# Patient Record
Sex: Female | Born: 1965 | Hispanic: Yes | Marital: Married | Smoking: Never smoker
Health system: Southern US, Community
[De-identification: ages and names within clinical notes are randomized; demographics above are authoritative.]

## PROBLEM LIST (undated history)

## (undated) DIAGNOSIS — Z789 Other specified health status: Secondary | ICD-10-CM

## (undated) HISTORY — PX: CHOLECYSTECTOMY: SHX55

---

## 2004-04-11 ENCOUNTER — Emergency Department: Payer: Self-pay | Admitting: Emergency Medicine

## 2005-12-03 ENCOUNTER — Observation Stay: Payer: Self-pay | Admitting: General Surgery

## 2017-10-11 ENCOUNTER — Encounter: Payer: Self-pay | Admitting: Family Medicine

## 2018-07-24 ENCOUNTER — Encounter (HOSPITAL_COMMUNITY): Payer: Self-pay | Admitting: Emergency Medicine

## 2018-07-24 ENCOUNTER — Inpatient Hospital Stay (HOSPITAL_COMMUNITY): Payer: Worker's Compensation | Admitting: Certified Registered Nurse Anesthetist

## 2018-07-24 ENCOUNTER — Inpatient Hospital Stay (HOSPITAL_COMMUNITY): Payer: Worker's Compensation

## 2018-07-24 ENCOUNTER — Other Ambulatory Visit: Payer: Self-pay

## 2018-07-24 ENCOUNTER — Encounter (HOSPITAL_COMMUNITY)
Admission: EM | Disposition: A | Discharge status: Home / Self Care | Payer: Self-pay | Source: Home / Self Care | Attending: Student

## 2018-07-24 ENCOUNTER — Inpatient Hospital Stay (HOSPITAL_COMMUNITY)
Admission: EM | Admit: 2018-07-24 | Discharge: 2018-07-26 | DRG: 902 | Disposition: A | Discharge status: Home / Self Care | Payer: Worker's Compensation | Attending: Student | Admitting: Student

## 2018-07-24 DIAGNOSIS — S92332B Displaced fracture of third metatarsal bone, left foot, initial encounter for open fracture: Secondary | ICD-10-CM | POA: Diagnosis present

## 2018-07-24 DIAGNOSIS — W230XXA Caught, crushed, jammed, or pinched between moving objects, initial encounter: Secondary | ICD-10-CM | POA: Diagnosis present

## 2018-07-24 DIAGNOSIS — S99922A Unspecified injury of left foot, initial encounter: Secondary | ICD-10-CM | POA: Diagnosis present

## 2018-07-24 DIAGNOSIS — S92312A Displaced fracture of first metatarsal bone, left foot, initial encounter for closed fracture: Secondary | ICD-10-CM | POA: Diagnosis present

## 2018-07-24 DIAGNOSIS — S91312A Laceration without foreign body, left foot, initial encounter: Secondary | ICD-10-CM | POA: Diagnosis present

## 2018-07-24 DIAGNOSIS — Z419 Encounter for procedure for purposes other than remedying health state, unspecified: Secondary | ICD-10-CM

## 2018-07-24 DIAGNOSIS — S98132A Complete traumatic amputation of one left lesser toe, initial encounter: Principal | ICD-10-CM

## 2018-07-24 DIAGNOSIS — S9782XA Crushing injury of left foot, initial encounter: Secondary | ICD-10-CM | POA: Diagnosis present

## 2018-07-24 DIAGNOSIS — S92312B Displaced fracture of first metatarsal bone, left foot, initial encounter for open fracture: Secondary | ICD-10-CM

## 2018-07-24 DIAGNOSIS — T148XXA Other injury of unspecified body region, initial encounter: Secondary | ICD-10-CM

## 2018-07-24 DIAGNOSIS — S92322A Displaced fracture of second metatarsal bone, left foot, initial encounter for closed fracture: Secondary | ICD-10-CM | POA: Diagnosis present

## 2018-07-24 DIAGNOSIS — S92322B Displaced fracture of second metatarsal bone, left foot, initial encounter for open fracture: Secondary | ICD-10-CM

## 2018-07-24 DIAGNOSIS — S9782XS Crushing injury of left foot, sequela: Secondary | ICD-10-CM | POA: Diagnosis not present

## 2018-07-24 DIAGNOSIS — I96 Gangrene, not elsewhere classified: Secondary | ICD-10-CM | POA: Diagnosis not present

## 2018-07-24 DIAGNOSIS — Y990 Civilian activity done for income or pay: Secondary | ICD-10-CM | POA: Diagnosis not present

## 2018-07-24 DIAGNOSIS — S92532B Displaced fracture of distal phalanx of left lesser toe(s), initial encounter for open fracture: Secondary | ICD-10-CM | POA: Diagnosis present

## 2018-07-24 DIAGNOSIS — S93325S Dislocation of tarsometatarsal joint of left foot, sequela: Secondary | ICD-10-CM | POA: Diagnosis not present

## 2018-07-24 DIAGNOSIS — S98222A Partial traumatic amputation of two or more left lesser toes, initial encounter: Secondary | ICD-10-CM

## 2018-07-24 HISTORY — PX: ORIF TOE FRACTURE: SHX5032

## 2018-07-24 HISTORY — PX: AMPUTATION TOE: SHX6595

## 2018-07-24 HISTORY — PX: I & D EXTREMITY: SHX5045

## 2018-07-24 LAB — CBC WITH DIFFERENTIAL/PLATELET
ABS IMMATURE GRANULOCYTES: 0.08 10*3/uL — AB (ref 0.00–0.07)
BASOS PCT: 0 %
Basophils Absolute: 0.1 10*3/uL (ref 0.0–0.1)
Eosinophils Absolute: 0.1 10*3/uL (ref 0.0–0.5)
Eosinophils Relative: 0 %
HEMATOCRIT: 43.5 % (ref 36.0–46.0)
HEMOGLOBIN: 13.8 g/dL (ref 12.0–15.0)
IMMATURE GRANULOCYTES: 1 %
LYMPHS ABS: 2.5 10*3/uL (ref 0.7–4.0)
LYMPHS PCT: 15 %
MCH: 27.9 pg (ref 26.0–34.0)
MCHC: 31.7 g/dL (ref 30.0–36.0)
MCV: 87.9 fL (ref 80.0–100.0)
MONOS PCT: 4 %
Monocytes Absolute: 0.7 10*3/uL (ref 0.1–1.0)
NEUTROS ABS: 12.9 10*3/uL — AB (ref 1.7–7.7)
NEUTROS PCT: 80 %
PLATELETS: 211 10*3/uL (ref 150–400)
RBC: 4.95 MIL/uL (ref 3.87–5.11)
RDW: 13.9 % (ref 11.5–15.5)
WBC: 16.3 10*3/uL — ABNORMAL HIGH (ref 4.0–10.5)
nRBC: 0 % (ref 0.0–0.2)

## 2018-07-24 LAB — ETHANOL: Alcohol, Ethyl (B): <10 mg/dL (ref <10)

## 2018-07-24 LAB — COMPREHENSIVE METABOLIC PANEL
ALT: 14 U/L (ref 0–44)
AST: 24 U/L (ref 15–41)
Albumin: 4.1 g/dL (ref 3.5–5.0)
Alkaline Phosphatase: 70 U/L (ref 38–126)
Anion gap: 8 (ref 5–15)
BUN: 9 mg/dL (ref 6–20)
CO2: 21 mmol/L — AB (ref 22–32)
CREATININE: 0.62 mg/dL (ref 0.44–1.00)
Calcium: 9.2 mg/dL (ref 8.9–10.3)
Chloride: 107 mmol/L (ref 98–111)
GFR calc Af Amer: >60 mL/min (ref >60)
Glucose, Bld: 109 mg/dL — ABNORMAL HIGH (ref 70–99)
Potassium: 3.7 mmol/L (ref 3.5–5.1)
Sodium: 136 mmol/L (ref 135–145)
Total Bilirubin: 0.6 mg/dL (ref 0.3–1.2)
Total Protein: 7.2 g/dL (ref 6.5–8.1)

## 2018-07-24 SURGERY — IRRIGATION AND DEBRIDEMENT EXTREMITY
Anesthesia: General | Laterality: Left

## 2018-07-24 MED ORDER — ACETAMINOPHEN 325 MG PO TABS: 650.0000 mg | ORAL_TABLET | Freq: Four times a day (QID) | ORAL | Status: DC | PRN

## 2018-07-24 MED ORDER — TOBRAMYCIN SULFATE 1.2 G IJ SOLR
INTRAMUSCULAR | Status: AC
  Filled 2018-07-24: qty 1.2

## 2018-07-24 MED ORDER — ROCURONIUM BROMIDE 50 MG/5ML IV SOSY
PREFILLED_SYRINGE | INTRAVENOUS | Status: AC
  Filled 2018-07-24: qty 5

## 2018-07-24 MED ORDER — ACETAMINOPHEN 650 MG RE SUPP: 650.0000 mg | Freq: Four times a day (QID) | RECTAL | Status: DC | PRN

## 2018-07-24 MED ORDER — ENOXAPARIN SODIUM 40 MG/0.4ML ~~LOC~~ SOLN
40.0000 mg | SUBCUTANEOUS | Status: DC
  Filled 2018-07-24: qty 0.4

## 2018-07-24 MED ORDER — MIDAZOLAM HCL 2 MG/2ML IJ SOLN
INTRAMUSCULAR | Status: AC
  Filled 2018-07-24: qty 2

## 2018-07-24 MED ORDER — BUPIVACAINE HCL (PF) 0.5 % IJ SOLN
INTRAMUSCULAR | Status: DC | PRN
  Administered 2018-07-24: 8 mL

## 2018-07-24 MED ORDER — LIDOCAINE 2% (20 MG/ML) 5 ML SYRINGE
INTRAMUSCULAR | Status: AC
  Filled 2018-07-24: qty 10

## 2018-07-24 MED ORDER — PROPOFOL 10 MG/ML IV BOLUS
INTRAVENOUS | Status: DC | PRN
  Administered 2018-07-24: 160 mg via INTRAVENOUS

## 2018-07-24 MED ORDER — DEXAMETHASONE SODIUM PHOSPHATE 10 MG/ML IJ SOLN
INTRAMUSCULAR | Status: AC
  Filled 2018-07-24: qty 1

## 2018-07-24 MED ORDER — ACETAMINOPHEN 10 MG/ML IV SOLN
INTRAVENOUS | Status: AC
  Filled 2018-07-24: qty 100

## 2018-07-24 MED ORDER — MORPHINE SULFATE (PF) 4 MG/ML IV SOLN
4.0000 mg | Freq: Once | INTRAVENOUS | Status: AC
  Administered 2018-07-24: 4 mg via INTRAVENOUS
  Filled 2018-07-24: qty 1

## 2018-07-24 MED ORDER — FENTANYL CITRATE (PF) 100 MCG/2ML IJ SOLN
INTRAMUSCULAR | Status: AC
  Filled 2018-07-24: qty 2

## 2018-07-24 MED ORDER — FENTANYL CITRATE (PF) 250 MCG/5ML IJ SOLN
INTRAMUSCULAR | Status: DC | PRN
  Administered 2018-07-24: 50 ug via INTRAVENOUS
  Administered 2018-07-24: 100 ug via INTRAVENOUS
  Administered 2018-07-24: 50 ug via INTRAVENOUS

## 2018-07-24 MED ORDER — GABAPENTIN 100 MG PO CAPS
100.0000 mg | ORAL_CAPSULE | Freq: Three times a day (TID) | ORAL | Status: DC
  Administered 2018-07-24 – 2018-07-26 (×5): 100 mg via ORAL
  Filled 2018-07-24 (×5): qty 1

## 2018-07-24 MED ORDER — CEFAZOLIN SODIUM-DEXTROSE 1-4 GM/50ML-% IV SOLN
1.0000 g | Freq: Once | INTRAVENOUS | Status: AC
  Administered 2018-07-24: 1 g via INTRAVENOUS

## 2018-07-24 MED ORDER — SUCCINYLCHOLINE CHLORIDE 200 MG/10ML IV SOSY
PREFILLED_SYRINGE | INTRAVENOUS | Status: AC
  Filled 2018-07-24: qty 10

## 2018-07-24 MED ORDER — DEXAMETHASONE SODIUM PHOSPHATE 10 MG/ML IJ SOLN
INTRAMUSCULAR | Status: DC | PRN
  Administered 2018-07-24: 5 mg via INTRAVENOUS

## 2018-07-24 MED ORDER — SODIUM CHLORIDE 0.9 % IR SOLN
Status: DC | PRN
  Administered 2018-07-24: 3000 mL

## 2018-07-24 MED ORDER — ACETAMINOPHEN 325 MG PO TABS
650.0000 mg | ORAL_TABLET | Freq: Four times a day (QID) | ORAL | Status: DC
  Administered 2018-07-25 – 2018-07-26 (×7): 650 mg via ORAL
  Filled 2018-07-24 (×7): qty 2

## 2018-07-24 MED ORDER — ONDANSETRON HCL 4 MG/2ML IJ SOLN: 4.0000 mg | Freq: Four times a day (QID) | INTRAMUSCULAR | Status: DC | PRN

## 2018-07-24 MED ORDER — 0.9 % SODIUM CHLORIDE (POUR BTL) OPTIME
TOPICAL | Status: DC | PRN
  Administered 2018-07-24: 1000 mL

## 2018-07-24 MED ORDER — PHENYLEPHRINE 40 MCG/ML (10ML) SYRINGE FOR IV PUSH (FOR BLOOD PRESSURE SUPPORT)
PREFILLED_SYRINGE | INTRAVENOUS | Status: DC | PRN
  Administered 2018-07-24: 40 ug via INTRAVENOUS

## 2018-07-24 MED ORDER — METHOCARBAMOL 1000 MG/10ML IJ SOLN
500.0000 mg | Freq: Four times a day (QID) | INTRAVENOUS | Status: DC | PRN
  Filled 2018-07-24: qty 5

## 2018-07-24 MED ORDER — PHENYLEPHRINE 40 MCG/ML (10ML) SYRINGE FOR IV PUSH (FOR BLOOD PRESSURE SUPPORT)
PREFILLED_SYRINGE | INTRAVENOUS | Status: AC
  Filled 2018-07-24: qty 20

## 2018-07-24 MED ORDER — CEFAZOLIN SODIUM-DEXTROSE 2-4 GM/100ML-% IV SOLN: 2.0000 g | Freq: Three times a day (TID) | INTRAVENOUS | Status: DC

## 2018-07-24 MED ORDER — SUGAMMADEX SODIUM 200 MG/2ML IV SOLN
INTRAVENOUS | Status: DC | PRN
  Administered 2018-07-24: 200 mg via INTRAVENOUS

## 2018-07-24 MED ORDER — PHENYLEPHRINE 40 MCG/ML (10ML) SYRINGE FOR IV PUSH (FOR BLOOD PRESSURE SUPPORT)
PREFILLED_SYRINGE | INTRAVENOUS | Status: AC
  Filled 2018-07-24: qty 10

## 2018-07-24 MED ORDER — ONDANSETRON HCL 4 MG PO TABS: 4.0000 mg | ORAL_TABLET | Freq: Four times a day (QID) | ORAL | Status: DC | PRN

## 2018-07-24 MED ORDER — EPHEDRINE 5 MG/ML INJ
INTRAVENOUS | Status: AC
  Filled 2018-07-24: qty 20

## 2018-07-24 MED ORDER — EPHEDRINE 5 MG/ML INJ
INTRAVENOUS | Status: AC
  Filled 2018-07-24: qty 10

## 2018-07-24 MED ORDER — MORPHINE SULFATE (PF) 2 MG/ML IV SOLN
2.0000 mg | INTRAVENOUS | Status: DC | PRN
  Administered 2018-07-26: 2 mg via INTRAVENOUS
  Filled 2018-07-24: qty 1

## 2018-07-24 MED ORDER — OXYCODONE HCL 5 MG/5ML PO SOLN: 5.0000 mg | Freq: Once | ORAL | Status: AC | PRN

## 2018-07-24 MED ORDER — TOBRAMYCIN SULFATE 1.2 G IJ SOLR
INTRAMUSCULAR | Status: DC | PRN
  Administered 2018-07-24: 1.2 g via TOPICAL

## 2018-07-24 MED ORDER — ONDANSETRON HCL 4 MG/2ML IJ SOLN: 4.0000 mg | Freq: Once | INTRAMUSCULAR | Status: DC | PRN

## 2018-07-24 MED ORDER — DEXAMETHASONE SODIUM PHOSPHATE 10 MG/ML IJ SOLN
INTRAMUSCULAR | Status: AC
  Filled 2018-07-24: qty 2

## 2018-07-24 MED ORDER — VANCOMYCIN HCL 1000 MG IV SOLR
INTRAVENOUS | Status: DC | PRN
  Administered 2018-07-24: 1000 mg via TOPICAL

## 2018-07-24 MED ORDER — LIDOCAINE 2% (20 MG/ML) 5 ML SYRINGE
INTRAMUSCULAR | Status: AC
  Filled 2018-07-24: qty 5

## 2018-07-24 MED ORDER — OXYCODONE HCL 5 MG PO TABS: 5.0000 mg | ORAL_TABLET | Freq: Once | ORAL | Status: DC | PRN

## 2018-07-24 MED ORDER — MORPHINE SULFATE (PF) 2 MG/ML IV SOLN: 2.0000 mg | INTRAVENOUS | Status: DC | PRN

## 2018-07-24 MED ORDER — CEFAZOLIN SODIUM-DEXTROSE 1-4 GM/50ML-% IV SOLN
INTRAVENOUS | Status: AC
  Filled 2018-07-24: qty 50

## 2018-07-24 MED ORDER — OXYCODONE HCL 5 MG PO TABS
ORAL_TABLET | ORAL | Status: AC
  Filled 2018-07-24: qty 1

## 2018-07-24 MED ORDER — TETANUS-DIPHTH-ACELL PERTUSSIS 5-2.5-18.5 LF-MCG/0.5 IM SUSP
0.5000 mL | Freq: Once | INTRAMUSCULAR | Status: AC
  Administered 2018-07-24: 0.5 mL via INTRAMUSCULAR
  Filled 2018-07-24: qty 0.5

## 2018-07-24 MED ORDER — ROCURONIUM BROMIDE 10 MG/ML (PF) SYRINGE
PREFILLED_SYRINGE | INTRAVENOUS | Status: DC | PRN
  Administered 2018-07-24: 50 mg via INTRAVENOUS

## 2018-07-24 MED ORDER — FENTANYL CITRATE (PF) 100 MCG/2ML IJ SOLN
25.0000 ug | INTRAMUSCULAR | Status: DC | PRN
  Administered 2018-07-24 (×2): 50 ug via INTRAVENOUS

## 2018-07-24 MED ORDER — FENTANYL CITRATE (PF) 250 MCG/5ML IJ SOLN
INTRAMUSCULAR | Status: AC
  Filled 2018-07-24: qty 5

## 2018-07-24 MED ORDER — FENTANYL CITRATE (PF) 100 MCG/2ML IJ SOLN
25.0000 ug | INTRAMUSCULAR | Status: DC | PRN
  Administered 2018-07-24: 50 ug via INTRAVENOUS

## 2018-07-24 MED ORDER — OXYCODONE HCL 5 MG/5ML PO SOLN: 5.0000 mg | Freq: Once | ORAL | Status: DC | PRN

## 2018-07-24 MED ORDER — SODIUM CHLORIDE 0.9 % IV SOLN
2.0000 g | INTRAVENOUS | Status: DC
  Administered 2018-07-24 – 2018-07-25 (×2): 2 g via INTRAVENOUS
  Filled 2018-07-24 (×3): qty 20

## 2018-07-24 MED ORDER — MIDAZOLAM HCL 2 MG/2ML IJ SOLN
INTRAMUSCULAR | Status: DC | PRN
  Administered 2018-07-24: 1 mg via INTRAVENOUS

## 2018-07-24 MED ORDER — CHLORHEXIDINE GLUCONATE 4 % EX LIQD: 60.0000 mL | Freq: Once | CUTANEOUS | Status: DC

## 2018-07-24 MED ORDER — LIDOCAINE 2% (20 MG/ML) 5 ML SYRINGE
INTRAMUSCULAR | Status: DC | PRN
  Administered 2018-07-24: 40 mg via INTRAVENOUS

## 2018-07-24 MED ORDER — OXYCODONE HCL 5 MG PO TABS
5.0000 mg | ORAL_TABLET | ORAL | Status: DC | PRN
  Administered 2018-07-24: 10 mg via ORAL
  Administered 2018-07-25 – 2018-07-26 (×3): 15 mg via ORAL
  Filled 2018-07-24 (×2): qty 2
  Filled 2018-07-24 (×3): qty 3

## 2018-07-24 MED ORDER — CEFAZOLIN SODIUM-DEXTROSE 2-4 GM/100ML-% IV SOLN
2.0000 g | Freq: Once | INTRAVENOUS | Status: AC
  Administered 2018-07-24: 2 g via INTRAVENOUS
  Filled 2018-07-24: qty 100

## 2018-07-24 MED ORDER — OXYCODONE HCL 5 MG PO TABS
5.0000 mg | ORAL_TABLET | Freq: Once | ORAL | Status: AC | PRN
  Administered 2018-07-24: 5 mg via ORAL

## 2018-07-24 MED ORDER — ONDANSETRON HCL 4 MG/2ML IJ SOLN
INTRAMUSCULAR | Status: AC
  Filled 2018-07-24: qty 2

## 2018-07-24 MED ORDER — BUPIVACAINE HCL (PF) 0.5 % IJ SOLN
INTRAMUSCULAR | Status: AC
  Filled 2018-07-24: qty 30

## 2018-07-24 MED ORDER — METHOCARBAMOL 500 MG PO TABS
500.0000 mg | ORAL_TABLET | Freq: Four times a day (QID) | ORAL | Status: DC | PRN
  Administered 2018-07-24 – 2018-07-26 (×2): 500 mg via ORAL
  Filled 2018-07-24 (×2): qty 1

## 2018-07-24 MED ORDER — LACTATED RINGERS IV SOLN
INTRAVENOUS | Status: DC
  Administered 2018-07-24: 15:00:00 via INTRAVENOUS

## 2018-07-24 MED ORDER — ONDANSETRON HCL 4 MG/2ML IJ SOLN
INTRAMUSCULAR | Status: DC | PRN
  Administered 2018-07-24: 4 mg via INTRAVENOUS

## 2018-07-24 MED ORDER — ACETAMINOPHEN 10 MG/ML IV SOLN
1000.0000 mg | Freq: Once | INTRAVENOUS | Status: DC | PRN
  Administered 2018-07-24: 1000 mg via INTRAVENOUS

## 2018-07-24 SURGICAL SUPPLY — 72 items
BANDAGE ACE 4X5 VEL STRL LF (GAUZE/BANDAGES/DRESSINGS) ×3 IMPLANT
BANDAGE ACE 6X5 VEL STRL LF (GAUZE/BANDAGES/DRESSINGS) IMPLANT
BANDAGE ESMARK 6X9 LF (GAUZE/BANDAGES/DRESSINGS) ×1 IMPLANT
BLADE SURG 10 STRL SS (BLADE) ×3 IMPLANT
BNDG COHESIVE 4X5 TAN STRL (GAUZE/BANDAGES/DRESSINGS) ×3 IMPLANT
BNDG ESMARK 6X9 LF (GAUZE/BANDAGES/DRESSINGS) ×3
BNDG GAUZE ELAST 4 BULKY (GAUZE/BANDAGES/DRESSINGS) ×6 IMPLANT
BRUSH SCRUB SURG 4.25 DISP (MISCELLANEOUS) ×6 IMPLANT
CANISTER WOUNDNEG PRESSURE 500 (CANNISTER) ×3 IMPLANT
CHLORAPREP W/TINT 26ML (MISCELLANEOUS) ×3 IMPLANT
COVER MAYO STAND STRL (DRAPES) ×3 IMPLANT
COVER SURGICAL LIGHT HANDLE (MISCELLANEOUS) ×6 IMPLANT
COVER WAND RF STERILE (DRAPES) ×3 IMPLANT
DRAPE C-ARM 42X72 X-RAY (DRAPES) IMPLANT
DRAPE C-ARMOR (DRAPES) ×3 IMPLANT
DRAPE ORTHO SPLIT 77X108 STRL (DRAPES) ×2
DRAPE SURG 17X23 STRL (DRAPES) ×3 IMPLANT
DRAPE SURG ORHT 6 SPLT 77X108 (DRAPES) ×1 IMPLANT
DRAPE U-SHAPE 47X51 STRL (DRAPES) ×3 IMPLANT
DRSG ADAPTIC 3X8 NADH LF (GAUZE/BANDAGES/DRESSINGS) ×3 IMPLANT
DRSG EMULSION OIL 3X3 NADH (GAUZE/BANDAGES/DRESSINGS) IMPLANT
DRSG VAC ATS MED SENSATRAC (GAUZE/BANDAGES/DRESSINGS) ×3 IMPLANT
ELECT REM PT RETURN 9FT ADLT (ELECTROSURGICAL) ×3
ELECTRODE REM PT RTRN 9FT ADLT (ELECTROSURGICAL) ×1 IMPLANT
EVACUATOR 1/8 PVC DRAIN (DRAIN) IMPLANT
GAUZE SPONGE 4X4 12PLY STRL (GAUZE/BANDAGES/DRESSINGS) ×3 IMPLANT
GLOVE BIO SURGEON STRL SZ 6.5 (GLOVE) ×6 IMPLANT
GLOVE BIO SURGEON STRL SZ7.5 (GLOVE) ×12 IMPLANT
GLOVE BIO SURGEONS STRL SZ 6.5 (GLOVE) ×3
GLOVE BIOGEL PI IND STRL 6.5 (GLOVE) ×1 IMPLANT
GLOVE BIOGEL PI IND STRL 7.5 (GLOVE) ×1 IMPLANT
GLOVE BIOGEL PI INDICATOR 6.5 (GLOVE) ×2
GLOVE BIOGEL PI INDICATOR 7.5 (GLOVE) ×2
GOWN STRL REUS W/ TWL LRG LVL3 (GOWN DISPOSABLE) ×2 IMPLANT
GOWN STRL REUS W/TWL LRG LVL3 (GOWN DISPOSABLE) ×4
HANDPIECE INTERPULSE COAX TIP (DISPOSABLE) ×2
KIT BASIN OR (CUSTOM PROCEDURE TRAY) ×3 IMPLANT
KIT TURNOVER KIT B (KITS) ×3 IMPLANT
MANIFOLD NEPTUNE II (INSTRUMENTS) ×3 IMPLANT
NEEDLE 22X1 1/2 (OR ONLY) (NEEDLE) ×3 IMPLANT
NEEDLE HYPO 21X1.5 SAFETY (NEEDLE) IMPLANT
NS IRRIG 1000ML POUR BTL (IV SOLUTION) ×3 IMPLANT
PACK ORTHO EXTREMITY (CUSTOM PROCEDURE TRAY) ×3 IMPLANT
PAD ARMBOARD 7.5X6 YLW CONV (MISCELLANEOUS) ×6 IMPLANT
PAD CAST 4YDX4 CTTN HI CHSV (CAST SUPPLIES) ×1 IMPLANT
PADDING CAST COTTON 4X4 STRL (CAST SUPPLIES) ×2
PADDING CAST COTTON 6X4 STRL (CAST SUPPLIES) ×3 IMPLANT
PENCIL BUTTON HOLSTER BLD 10FT (ELECTRODE) ×3 IMPLANT
PIN CAPS ORTHO GREEN .062 (PIN) ×3 IMPLANT
SET HNDPC FAN SPRY TIP SCT (DISPOSABLE) ×1 IMPLANT
SPONGE LAP 18X18 RF (DISPOSABLE) ×9 IMPLANT
STAPLER VISISTAT 35W (STAPLE) IMPLANT
SUCTION FRAZIER HANDLE 10FR (MISCELLANEOUS) ×2
SUCTION TUBE FRAZIER 10FR DISP (MISCELLANEOUS) ×1 IMPLANT
SUT ETHILON 2 0 FS 18 (SUTURE) ×9 IMPLANT
SUT ETHILON 3 0 PS 1 (SUTURE) ×6 IMPLANT
SUT MNCRL AB 3-0 PS2 18 (SUTURE) ×3 IMPLANT
SUT MON AB 2-0 CT1 36 (SUTURE) ×3 IMPLANT
SUT PDS AB 0 CT 36 (SUTURE) IMPLANT
SUT PDS AB 2-0 CT1 27 (SUTURE) IMPLANT
SUT VIC AB 2-0 CT1 27 (SUTURE) ×4
SUT VIC AB 2-0 CT1 TAPERPNT 27 (SUTURE) ×2 IMPLANT
SUT VIC AB 2-0 CT3 27 (SUTURE) IMPLANT
SWAB CULTURE ESWAB REG 1ML (MISCELLANEOUS) IMPLANT
SYR CONTROL 10ML LL (SYRINGE) IMPLANT
TOWEL OR 17X24 6PK STRL BLUE (TOWEL DISPOSABLE) ×3 IMPLANT
TOWEL OR 17X26 10 PK STRL BLUE (TOWEL DISPOSABLE) ×6 IMPLANT
TUBE CONNECTING 12'X1/4 (SUCTIONS) ×1
TUBE CONNECTING 12X1/4 (SUCTIONS) ×2 IMPLANT
UNDERPAD 30X30 (UNDERPADS AND DIAPERS) ×3 IMPLANT
WATER STERILE IRR 1000ML POUR (IV SOLUTION) ×3 IMPLANT
YANKAUER SUCT BULB TIP NO VENT (SUCTIONS) ×3 IMPLANT

## 2018-07-24 NOTE — Progress Notes (Signed)
Anesthesiology Note:  Patient in PACU following l. Foot debridement, toe amputations, and ORIF metatarsal fractures. Patient C/O severe pain despite 150 mcg fentanyl.   Popliteal block and Adductor canal blocks done in PACU with good pain relief. See OR record for block procedure notes.  Pt received 20 cc 0.75% Ropivacaine for popliteal block and 15 cc 0.25% Bupivacaine with epi for adductor canal block.  Kipp Brood

## 2018-07-24 NOTE — Anesthesia Preprocedure Evaluation (Signed)
Anesthesia Evaluation  Patient identified by MRN, date of birth, ID band Patient awake    Reviewed: Allergy & Precautions, NPO status , Patient's Chart, lab work & pertinent test results  Airway Mallampati: II  TM Distance: >3 FB Neck ROM: Full    Dental  (+) Edentulous Upper, Dental Advisory Given   Pulmonary    breath sounds clear to auscultation       Cardiovascular  Rhythm:Regular Rate:Normal     Neuro/Psych    GI/Hepatic   Endo/Other    Renal/GU      Musculoskeletal   Abdominal   Peds  Hematology   Anesthesia Other Findings   Reproductive/Obstetrics                             Anesthesia Physical Anesthesia Plan  ASA: II and emergent  Anesthesia Plan: General   Post-op Pain Management:    Induction: Intravenous  PONV Risk Score and Plan: Ondansetron and Dexamethasone  Airway Management Planned: Oral ETT  Additional Equipment:   Intra-op Plan:   Post-operative Plan:   Informed Consent: I have reviewed the patients History and Physical, chart, labs and discussed the procedure including the risks, benefits and alternatives for the proposed anesthesia with the patient or authorized representative who has indicated his/her understanding and acceptance.     Dental advisory given  Plan Discussed with: CRNA and Anesthesiologist  Anesthesia Plan Comments:         Anesthesia Quick Evaluation

## 2018-07-24 NOTE — Anesthesia Procedure Notes (Signed)
Anesthesia Regional Block: Adductor canal block   Pre-Anesthetic Checklist: ,, timeout performed, Correct Patient, Correct Site, Correct Laterality, Correct Procedure, Correct Position, site marked, Risks and benefits discussed, pre-op evaluation,  At surgeon's request and post-op pain management  Laterality: Left  Prep: Maximum Sterile Barrier Precautions used, chloraprep       Needles:  Injection technique: Single-shot  Needle Type: Echogenic Stimulator Needle     Needle Length: 9cm  Needle Gauge: 21     Additional Needles:   Procedures:,,,, ultrasound used (permanent image in chart),,,,  Narrative:  Start time: 07/24/2018 6:15 PM End time: 07/24/2018 6:20 PM Injection made incrementally with aspirations every 5 mL.  Performed by: Personally  Anesthesiologist: Kipp Brood, MD  Additional Notes: 15 CC 0.25% Bupivacaine with 1:200 epi injected easily

## 2018-07-24 NOTE — ED Triage Notes (Signed)
Pt arrives to ED from work with complaints of a left foot injury from getting ran over by a forklift. EMS reports the patients 2nd and 3rd toes have been amputated after the injury. Pt given fentanyl via EMS.

## 2018-07-24 NOTE — Progress Notes (Signed)
Dr. Noreene Larsson aware of NPO intake.

## 2018-07-24 NOTE — Transfer of Care (Signed)
Immediate Anesthesia Transfer of Care Note  Patient: Katelyn Chapman  Procedure(s) Performed: IRRIGATION AND DEBRIDEMENT EXTREMITY (Left ) AMPUTATION TOE (Left ) OPEN REDUCTION INTERNAL FIXATION (ORIF) METATARSAL (TOE) FRACTURE (Left )  Patient Location: PACU  Anesthesia Type:General  Level of Consciousness: awake, alert , oriented and patient cooperative  Airway & Oxygen Therapy: Patient Spontanous Breathing and Patient connected to nasal cannula oxygen  Post-op Assessment: Report given to RN, Post -op Vital signs reviewed and stable and Patient moving all extremities  Post vital signs: Reviewed and stable  Last Vitals:  Vitals Value Taken Time  BP 135/81 07/24/2018  5:00 PM  Temp 36.4 C 07/24/2018  5:00 PM  Pulse 85 07/24/2018  5:02 PM  Resp 15 07/24/2018  5:02 PM  SpO2 98 % 07/24/2018  5:02 PM  Vitals shown include unvalidated device data.  Last Pain:  Vitals:   07/24/18 1700  TempSrc:   PainSc: (P) 3       Patients Stated Pain Goal: 0 (07/24/18 1234)  Complications: No apparent anesthesia complications

## 2018-07-24 NOTE — ED Provider Notes (Addendum)
[facility] EMERGENCY DEPARTMENT Provider Note   CSN: [id number] Arrival date & time: 07/24/18  1229    History   Chief Complaint Chief Complaint  Patient presents with  . Foot Injury    HPI Katelyn Chapman is a 53 y.o. female.     53 y.o female with no PMH presents to the ED s/p left foot injury at work x 1 hour ago. Patient reports she was at work today walking when a coworker ran her over with a fork left.  She currently works at [employer] in [place].  She reports left foot pain, unable to ambulate after this incident and arrived via EMS.  Her last tetanus shot is unknown.  Patient's last meal was last night, she does report snacking on some chocolate this morning around 9 AM.  Denies any previous allergies or medical history.       OB History   No obstetric history on file.      Home Medications    Prior to Admission medications   Not on File    Family History History reviewed. No pertinent family history.  Social History Social History   Tobacco Use  . Smoking status: Never Smoker  . Smokeless tobacco: Never Used  Substance Use Topics  . Alcohol use: Never    Frequency: Never  . Drug use: Never     Allergies   Patient has no known allergies.   Review of Systems Review of Systems  Constitutional: Negative for chills and fever.  HENT: Negative for ear pain and sore throat.   Eyes: Negative for pain and visual disturbance.  Respiratory: Negative for cough and shortness of breath.   Cardiovascular: Negative for chest pain and palpitations.  Gastrointestinal: Negative for abdominal pain and vomiting.  Genitourinary: Negative for dysuria and hematuria.  Musculoskeletal: Negative for arthralgias and back pain.  Skin: Positive for wound. Negative for color change and rash.  Neurological: Negative for seizures and syncope.  All other systems reviewed and are negative.    Physical Exam Updated Vital  Signs BP 97/63   Pulse 81   Temp 98.6 F (37 C) (Oral)   Resp 19   Ht 5' (1.524 m)   Wt 68 kg   SpO2 99%   BMI 29.29 kg/m   Physical Exam Vitals signs and nursing note reviewed.  Constitutional:      General: She is not in acute distress.    Appearance: She is well-developed.  HENT:     Head: Normocephalic and atraumatic.     Mouth/Throat:     Pharynx: No oropharyngeal exudate.  Eyes:     Pupils: Pupils are equal, round, and reactive to light.  Neck:     Musculoskeletal: Normal range of motion.  Cardiovascular:     Rate and Rhythm: Regular rhythm.     Pulses:          Dorsalis pedis pulses are detected w/ Doppler on the left side.       Posterior tibial pulses are detected w/ Doppler on the left side.     Heart sounds: Normal heart sounds.  Pulmonary:     Effort: Pulmonary effort is normal. No respiratory distress.     Breath sounds: Normal breath sounds.  Abdominal:     General: Bowel sounds are normal. There is no distension.     Palpations: Abdomen is soft.     Tenderness: There is no abdominal tenderness.  Musculoskeletal:     Right  lower leg: No edema.     Left lower leg: No edema.     Left foot: Tenderness, bony tenderness, deformity and laceration present. No crepitus.       Feet:  Feet:     Comments: Amputation of multiple digits, please see photo attached.  Skin:    General: Skin is warm and dry.  Neurological:     Mental Status: She is alert and oriented to person, place, and time.          ED Treatments / Results  Labs (all labs ordered are listed, but only abnormal results are displayed) Labs Reviewed  CBC WITH DIFFERENTIAL/PLATELET - Abnormal; Notable for the following components:      Result Value   WBC 16.3 (*)    Neutro Abs 12.9 (*)    Abs Immature Granulocytes 0.08 (*)    All other components within normal limits  COMPREHENSIVE METABOLIC PANEL - Abnormal; Notable for the following components:   CO2 21 (*)    Glucose, Bld 109 (*)     All other components within normal limits  ETHANOL  RAPID URINE DRUG SCREEN, HOSP PERFORMED  HIV ANTIBODY (ROUTINE TESTING W REFLEX)    EKG None  Radiology Dg Foot Complete Left  Result Date: 07/24/2018 CLINICAL DATA:  Left foot injury. Patient's foot was run over by a forklift. EXAM: LEFT FOOT - COMPLETE 3+ VIEW COMPARISON:  None. FINDINGS: There are multiple fractures as well as extensive soft tissue injury in the distal forefoot. Detail is limited by overlying bandages. There is a mildly displaced intra-articular fracture involving the base of the 1st metatarsal. There are mildly displaced extra-articular fractures involving the base of the 2nd metatarsal and the mid 3rd metatarsal shaft. There are apparent partial amputation injuries of all the distal digits with a possible exception of the 2nd digit. There are associated distal phalangeal fractures. There is a possible fracture of the head of the 1st proximal phalanx with mild dorsal lateral subluxation of the 1st interphalangeal joint. No midfoot or hindfoot fractures are identified. There is no evidence of radiopaque foreign body. IMPRESSION: 1. Multiple acute fractures involving the 1st through 3rd metatarsals and toes as described. 2. Significant forefoot soft tissue injury with apparent partial distal amputations. 3. Bone detail limited by overlying bandages. Electronically Signed   By: Carey Bullocks M.D.   On: 07/24/2018 13:56    Procedures Procedures (including critical care time)  Medications Ordered in ED Medications  acetaminophen (TYLENOL) tablet 650 mg (has no administration in time range)    Or  acetaminophen (TYLENOL) suppository 650 mg (has no administration in time range)  enoxaparin (LOVENOX) injection 40 mg (has no administration in time range)  morphine 2 MG/ML injection 2 mg (has no administration in time range)  methocarbamol (ROBAXIN) tablet 500 mg (has no administration in time range)    Or  methocarbamol  (ROBAXIN) 500 mg in dextrose 5 % 50 mL IVPB (has no administration in time range)  oxyCODONE (Oxy IR/ROXICODONE) immediate release tablet 5-15 mg (has no administration in time range)  ondansetron (ZOFRAN) tablet 4 mg (has no administration in time range)    Or  ondansetron (ZOFRAN) injection 4 mg (has no administration in time range)  morphine 4 MG/ML injection 4 mg (4 mg Intravenous Given 07/24/18 1306)  ceFAZolin (ANCEF) IVPB 2g/100 mL premix (2 g Intravenous New Bag/Given 07/24/18 1401)  Tdap (BOOSTRIX) injection 0.5 mL (0.5 mLs Intramuscular Given 07/24/18 1350)     Initial Impression / Assessment  and Plan / ED Course  I have reviewed the triage vital signs and the nursing notes.  Pertinent labs & imaging results that were available during my care of the patient were reviewed by me and considered in my medical decision making (see chart for details).       Patient here with left toe amputations of second and third her work accident.  Given 100 fentanyl in route for pain.  During evaluation patient is missing second and third toes, please see photo attached.  Pending x-ray.  Consulted Earney Hamburg, PA who evaluated patient in the ED, patient will be taken to the OR by Dr. Jena Gauss.  Patient's last meal was last night, reports did snack on some chocolate this morning around 9 AM.  Patient was given morphine 4 mg IM.  Per HR they requested a UDS along with an ethanol level which I have added to her blood work.  DG foot showed:   1. Multiple acute fractures involving the 1st through 3rd  metatarsals and toes as described.  2. Significant forefoot soft tissue injury with apparent partial  distal amputations.  3. Bone detail limited by overlying bandages.    Cbc showed increase in WBC's suspect this is due to distress. CMP showed elevated glucose, patients last meal  Patient will be admitted through orthopedics service for repair by Dr. Jena Gauss. Patient tetanus status updated.   Final  Clinical Impressions(s) / ED Diagnoses   Final diagnoses:  Amputation of third toe, left, traumatic, initial encounter [facility])  Traumatic amputation of second toe of left foot, initial encounter [facility])    ED Discharge Orders    None       Claude Manges, PA-C 07/24/18 1440    Claude Manges, PA-C 07/24/18 1445    Jacalyn Lefevre, MD 07/24/18 1521

## 2018-07-24 NOTE — Anesthesia Procedure Notes (Signed)
Anesthesia Regional Block: Popliteal block   Pre-Anesthetic Checklist: ,, timeout performed, Correct Patient, Correct Site, Correct Laterality, Correct Procedure, Correct Position, site marked, Risks and benefits discussed,  Surgical consent,  Pre-op evaluation,  At surgeon's request and post-op pain management  Laterality: Left  Prep: chloraprep       Needles:  Injection technique: Single-shot  Needle Type: Stimulator Needle - 40      Needle Gauge: 22     Additional Needles:   Procedures:, nerve stimulator,,, ultrasound used (permanent image in chart),,,,  Narrative:  Start time: 07/24/2018 6:10 PM End time: 07/24/2018 6:15 PM Injection made incrementally with aspirations every 5 mL.  Performed by: Personally  Anesthesiologist: Kipp Brood, MD  Additional Notes: 20 cc 0.75% Ropivacaine injected easily

## 2018-07-24 NOTE — H&P (Signed)
Please see orthopaedic consult note for full H&P 

## 2018-07-24 NOTE — Addendum Note (Signed)
Addendum  created 07/24/18 1914 by Kipp Brood, MD   Child order released for a procedure order, Clinical Note Signed, Image imported, Intraprocedure Blocks edited

## 2018-07-24 NOTE — ED Notes (Signed)
Pedal pulses present (doppler)

## 2018-07-24 NOTE — Anesthesia Postprocedure Evaluation (Signed)
Anesthesia Post Note  Patient: [name]  Procedure(s) Performed: IRRIGATION AND DEBRIDEMENT EXTREMITY (Left ) AMPUTATION TOE (Left ) OPEN REDUCTION INTERNAL FIXATION (ORIF) METATARSAL (TOE) FRACTURE (Left )     Patient location during evaluation: PACU Anesthesia Type: General Level of consciousness: awake and alert Pain management: pain level controlled Vital Signs Assessment: post-procedure vital signs reviewed and stable Respiratory status: spontaneous breathing, nonlabored ventilation, respiratory function stable and patient connected to nasal cannula oxygen Cardiovascular status: blood pressure returned to baseline and stable Postop Assessment: no apparent nausea or vomiting Anesthetic complications: no    Last Vitals:  Vitals:   07/24/18 1700 07/24/18 1715  BP: 135/81 (!) 117/99  Pulse: 86 82  Resp: 14 18  Temp: (!) 36.4 C   SpO2: 100% 100%    Last Pain:  Vitals:   07/24/18 1721  TempSrc:   PainSc: 10-Worst pain ever                 Roshaunda Starkey COKER

## 2018-07-24 NOTE — Anesthesia Procedure Notes (Signed)
Procedure Name: Intubation Date/Time: 07/24/2018 3:42 PM Performed by: Julieta Bellini, CRNA Pre-anesthesia Checklist: Patient identified, Emergency Drugs available, Suction available and Patient being monitored Patient Re-evaluated:Patient Re-evaluated prior to induction Oxygen Delivery Method: Circle system utilized Preoxygenation: Pre-oxygenation with 100% oxygen Induction Type: IV induction Ventilation: Mask ventilation without difficulty Laryngoscope Size: Mac and 3 Grade View: Grade I Tube type: Oral Tube size: 7.0 mm Number of attempts: 1 Airway Equipment and Method: Stylet Placement Confirmation: ETT inserted through vocal cords under direct vision,  positive ETCO2 and breath sounds checked- equal and bilateral Secured at: 20 cm Tube secured with: Tape Dental Injury: Teeth and Oropharynx as per pre-operative assessment

## 2018-07-24 NOTE — Op Note (Signed)
Orthopaedic Surgery Operative Note (CSN: [id number] ) Date of Surgery: 07/24/2018  Admit Date: 07/24/2018   Diagnoses: Pre-Op Diagnoses: Crush injury to left foot Left type IIIA 1st-5th open distal phalanx fractures Left open 3rd metatarsal fracture Left closed 2nd metatarsal base fracture Left 1st metatarsal base fracture  Post-Op Diagnosis: Same  Procedures: 1. CPT 28820x3-Amputation of 2nd-4th toes at metatarsalphalangeal joint 2. CPT 11012-Irrigation and debridement of left 1st and 5th distal phalanx fractures 3. CPT 28496-Percutaneous fixation of left great toe distal phalanx fracture 4. CPT 28470-Closed treatment of 1st-3rd metatarsal fractures 5. CPT 12035-Intermediate repair of lacerations of foot, >20 cm total 6. CPT 97605-Incisional wound vac placement  Surgeons : Primary: , Gillie Manners, MD  Assistant: Ulyses Southward, PA-C  Location:OR 7   Anesthesia:General   Antibiotics: Ancef 2g preop   Tourniquet time:None   Estimated Blood Loss:25 mL  Complications:None  Specimens:None   Implants: 1.40mm K-wire for left great toe fixation  Indications for Surgery: 53 year old female who was at work when she sustained an injury to her right foot.  It was crushed by a forklift.  She had severe soft tissue injury along with bony damage.  She was found to have multiple fractures with partial amputations of her distal phalanx.  Due to the complex nature of her injury I felt that proceeding urgently for irrigation debridement with amputation and possible fixation was appropriate.  Risks and benefits were discussed with the patient including bleeding, infection, malunion, nonunion, revision amputation, wound breakdown, nerve and blood vessel injury, even the possibility of loss of further elements of her foot she agreed to proceed with surgery and consent was obtained.  Operative Findings: 1.  Severe soft tissue injury to her second through fourth toes with partial amputation of the  distal phalanx with complete degloving of the remainder of the toes. 2.  Amputation through the metatarsal phalangeal joints of the second through fourth toes. 3.  Irrigation debridement of open wounds with open fracture wounds of the first and fifth distal phalanx fracture. 4.  Percutaneous fixation of left great toe distal phalanx fracture with a 1.6 mm K wire. 5.  Stable first through third metatarsal fractures with a closed treatment. 6.  Complex wound closure of the foot soft tissue totaling about 25 cm in length. 7.  Incisional wound VAC placement.  Procedure: The patient was identified in the preoperative holding area. Consent was confirmed with the patient and their family and all questions were answered. The operative extremity was marked after confirmation with the patient. she was then brought back to the operating room by our anesthesia colleagues.  She was placed under general anesthetic and carefully transferred over to a regular OR table.The operative extremity was then prepped and draped in usual sterile fashion. A preoperative timeout was performed to verify the patient, the procedure, and the extremity. Preoperative antibiotics were dosed.  A fluoroscopic image was used to show the bony injury.  I started by using excisional debridement with a knife to remove any nonviable tissue.  The second through fourth toes were completely degloved and the skin appeared to be nonviable.  The distal phalanx and proximal phalanx of these toes were completely skeletonized.  As a result I felt that there was no ability to salvage these.  I then performed a amputation of the 3 digits the through the metatarsal phalangeal joint.  I then performed debridement of the remaining soft tissue.  I felt that the skin was appropriate to salvage the first and fifth  toes.  Once I was pleased with the debridement I then thoroughly irrigated the wound with a low pressure pulsatile lavage.  I used approximately 3 L.   I then placed on 1 g of vancomycin powder 1.2 g of tobramycin powder into the wound.  I then closed the soft tissue over the metatarsal heads.  This was done with 2-0 Monocryl.  I used 2-0 Monocryl to provide a deep layer of closure for multiple areas of the incision.  There was a large skin flap of over the dorsal lateral portion of the foot that I was able to close with a 3-0 nylon.  I closed the remainder of the wound with 3-0 nylon.  The dorsal lateral skin flap was very tenuous however it was able to be placed back into a good position.  Total length of the laceration and closure was approximately 25 cm.  Once I closed the wound I then turned my attention to fixation of the great toe distal phalanx.  Using AP and lateral fluoroscopic imaging I appropriately positioned a 1.6 mm K wire in advanced it through the distal phalanx.  I crossed the intra-phalangeal joint and then cross the metatarsal phalangeal joint into the first metatarsal shaft.  I confirmed adequate reduction with fluoroscopy.  I then turned my attention to the other metatarsal fractures which appeared stable and were not requiring fixation.  I then placed an incisional wound VAC over the complex laceration using Adaptic and black granular foam sponge.  I connected to 125 mmHg and obtained a good seal.  The pin was then cut in a pin protector was placed.  The foot then was dressed with cast padding and an Ace wrap.  The patient was awoken from anesthesia and taken to PACU in stable condition.  Post Op Plan/Instructions: The patient will be nonweightbearing on her left lower extremity.  She placed in a hard soled shoe.  She will be placed on ceftriaxone for type III open fracture prophylaxis.  We will take down her dressing likely Monday morning to identify the viability of her wounds.  Possible discharge on Wednesday.  Patient will be placed on aspirin for DVT prophylaxis.  I was present and performed the entire surgery.  Ulyses Southward,  PA-C did assist me throughout the case. An assistant was necessary given the difficulty in approach, maintenance of reduction and ability to instrument the fracture.   Truitt Merle, MD [facility]

## 2018-07-24 NOTE — Consult Note (Signed)
Reason for Consult:Left foot injury Referring Physician: Para Skeans  Bedelia Salvucci is an 53 y.o. female.  HPI: Katelyn Chapman was at work and had her foot fun over by a forklift. He had severe lacerations and pain and came to the ED for evaluation. She c/o localized pain to the area.  History reviewed. No pertinent past medical history.  History reviewed. No pertinent surgical history.  History reviewed. No pertinent family history.  Social History:  reports that she has never smoked. She has never used smokeless tobacco. She reports that she does not drink alcohol or use drugs.  Allergies: Not on File  Medications: I have reviewed the patient's current medications.  No results found for this or any previous visit (from the past 48 hour(s)).  No results found.  Review of Systems  Constitutional: Negative for weight loss.  HENT: Negative for ear discharge, ear pain, hearing loss and tinnitus.   Eyes: Negative for blurred vision, double vision, photophobia and pain.  Respiratory: Negative for cough, sputum production and shortness of breath.   Cardiovascular: Negative for chest pain.  Gastrointestinal: Negative for abdominal pain, nausea and vomiting.  Genitourinary: Negative for dysuria, flank pain, frequency and urgency.  Musculoskeletal: Positive for joint pain (Left foot). Negative for back pain, falls, myalgias and neck pain.  Neurological: Negative for dizziness, tingling, sensory change, focal weakness, loss of consciousness and headaches.  Endo/Heme/Allergies: Does not bruise/bleed easily.  Psychiatric/Behavioral: Negative for depression, memory loss and substance abuse. The patient is not nervous/anxious.    Blood pressure (!) 141/79, pulse 81, temperature 98.6 F (37 C), temperature source Oral, resp. rate (!) 21, height 5' (1.524 m), weight 68 kg, SpO2 100 %. Physical Exam  Constitutional: She appears well-developed and well-nourished. No distress.  HENT:  Head:  Normocephalic and atraumatic.  Eyes: Conjunctivae are normal. Right eye exhibits no discharge. Left eye exhibits no discharge. No scleral icterus.  Neck: Normal range of motion.  Cardiovascular: Normal rate and regular rhythm.  Respiratory: Effort normal. No respiratory distress.  Musculoskeletal:     Comments: LLE Multiple lacerations, partial amputation toes and midfoot, no ecchymosis or rash  Severe TTP  No knee or ankle effusion  Knee stable to varus/ valgus and anterior/posterior stress  Sens DPN, SPN, TN intact  Motor EHL, ext, flex, evers 5/5  DP 2+, PT 2+, No significant edema  Neurological: She is alert.  Skin: Skin is warm and dry. She is not diaphoretic.  Psychiatric: She has a normal mood and affect. Her behavior is normal.    Assessment/Plan: Left foot injuries -- Plan OR with Dr. Jena Gauss for I&D, revision amputation, and possible CRPP. NPO for now.    Freeman Caldron, PA-C Orthopedic Surgery [phone] 07/24/2018, 1:20 PM

## 2018-07-24 NOTE — ED Notes (Signed)
2 visitors sent to Pt's Rm 

## 2018-07-25 ENCOUNTER — Encounter (HOSPITAL_COMMUNITY): Payer: Self-pay | Admitting: Student

## 2018-07-25 LAB — CBC
HCT: 35.4 % — ABNORMAL LOW (ref 36.0–46.0)
Hemoglobin: 11.8 g/dL — ABNORMAL LOW (ref 12.0–15.0)
MCH: 28.7 pg (ref 26.0–34.0)
MCHC: 33.3 g/dL (ref 30.0–36.0)
MCV: 86.1 fL (ref 80.0–100.0)
Platelets: 200 10*3/uL (ref 150–400)
RBC: 4.11 MIL/uL (ref 3.87–5.11)
RDW: 14.1 % (ref 11.5–15.5)
WBC: 12.2 10*3/uL — ABNORMAL HIGH (ref 4.0–10.5)
nRBC: 0 % (ref 0.0–0.2)

## 2018-07-25 LAB — HIV ANTIBODY (ROUTINE TESTING W REFLEX): HIV Screen 4th Generation wRfx: NONREACTIVE

## 2018-07-25 MED ORDER — ASPIRIN 325 MG PO TABS
325.0000 mg | ORAL_TABLET | Freq: Every day | ORAL | Status: DC
  Administered 2018-07-26: 325 mg via ORAL
  Filled 2018-07-25: qty 1

## 2018-07-25 NOTE — Progress Notes (Signed)
Orthopaedic Trauma Progress Note  S: Doing okay this morning. Pain well controlled. Nerve block has not worn off, foot still feels numb. Family at bedside. No questions this morning. Will plan to work with PT today.  O:  Vitals:   07/25/18 0558 07/25/18 0740  BP: 102/68 107/61  Pulse: 64 70  Resp: 15   Temp: 98.5 F (36.9 C) 98.2 F (36.8 C)  SpO2: 100% 98%   General - sitting up in bed, NAD. Alert and oriented Cardiac - regular rate and rhythm Lungs -  CTA anterior lung fields bilaterally LLE - Dressing and wound vac in place to foot. Pin to 1st digit in place. About 50 mL output in wound vac canister. Minimal tenderness to palpation. Full plantarflexion/ dorsiflexion passively. Nerve block has not worn off yet, minimal sensation to dorsal or plantar aspect of foot. +DP pulse  Imaging: stable post op imaging  Labs:  Results for orders placed or performed during the hospital encounter of 07/24/18 (from the past 24 hour(s))  CBC with Differential     Status: Abnormal   Collection Time: 07/24/18  1:22 PM  Result Value Ref Range   WBC 16.3 (H) 4.0 - 10.5 K/uL   RBC 4.95 3.87 - 5.11 MIL/uL   Hemoglobin 13.8 12.0 - 15.0 g/dL   HCT 29.5 28.4 - 13.2 %   MCV 87.9 80.0 - 100.0 fL   MCH 27.9 26.0 - 34.0 pg   MCHC 31.7 30.0 - 36.0 g/dL   RDW 44.0 10.2 - 72.5 %   Platelets 211 150 - 400 K/uL   nRBC 0.0 0.0 - 0.2 %   Neutrophils Relative % 80 %   Neutro Abs 12.9 (H) 1.7 - 7.7 K/uL   Lymphocytes Relative 15 %   Lymphs Abs 2.5 0.7 - 4.0 K/uL   Monocytes Relative 4 %   Monocytes Absolute 0.7 0.1 - 1.0 K/uL   Eosinophils Relative 0 %   Eosinophils Absolute 0.1 0.0 - 0.5 K/uL   Basophils Relative 0 %   Basophils Absolute 0.1 0.0 - 0.1 K/uL   Immature Granulocytes 1 %   Abs Immature Granulocytes 0.08 (H) 0.00 - 0.07 K/uL  Comprehensive metabolic panel     Status: Abnormal   Collection Time: 07/24/18  1:22 PM  Result Value Ref Range   Sodium 136 135 - 145 mmol/L   Potassium 3.7 3.5 -  5.1 mmol/L   Chloride 107 98 - 111 mmol/L   CO2 21 (L) 22 - 32 mmol/L   Glucose, Bld 109 (H) 70 - 99 mg/dL   BUN 9 6 - 20 mg/dL   Creatinine, Ser 3.66 0.44 - 1.00 mg/dL   Calcium 9.2 8.9 - 44.0 mg/dL   Total Protein 7.2 6.5 - 8.1 g/dL   Albumin 4.1 3.5 - 5.0 g/dL   AST 24 15 - 41 U/L   ALT 14 0 - 44 U/L   Alkaline Phosphatase 70 38 - 126 U/L   Total Bilirubin 0.6 0.3 - 1.2 mg/dL   GFR calc non Af Amer >60 >60 mL/min   GFR calc Af Amer >60 >60 mL/min   Anion gap 8 5 - 15  Ethanol     Status: None   Collection Time: 07/24/18  1:22 PM  Result Value Ref Range   Alcohol, Ethyl (B) <10 <10 mg/dL  CBC     Status: Abnormal   Collection Time: 07/25/18  4:08 AM  Result Value Ref Range   WBC 12.2 (H) 4.0 -  10.5 K/uL   RBC 4.11 3.87 - 5.11 MIL/uL   Hemoglobin 11.8 (L) 12.0 - 15.0 g/dL   HCT 32.1 (L) 22.4 - 82.5 %   MCV 86.1 80.0 - 100.0 fL   MCH 28.7 26.0 - 34.0 pg   MCHC 33.3 30.0 - 36.0 g/dL   RDW 00.3 70.4 - 88.8 %   Platelets 200 150 - 400 K/uL   nRBC 0.0 0.0 - 0.2 %    Assessment: 53 year old female, left foot run over by forklift  Injuries: Crush injury left foot with below injuries s/p I&D, amputation of 2nd-4th toes at metatarsalphalangeal joint, percutaneous fixation of left great toe, laceratsion repair and placement of incisional wound vac  1. Left type IIIA 1st-5th open distal phalanx fractures 2. Left open 3rd metatarsal fracture 3. Left closed 2nd metatarsal base fracture  4. Left 1st metatarsal base fracture s/p percutaneous fixation    Weightbearing: NWB LLE  Insicional and dressing care: wound vac and ace wrap in place, do not remove. Will plan to change tomorrow  Orthopedic device(s): hard sole post op shoe  CV/Blood loss: Hgb 11.8 this AM, hemodynamically stable  Pain management: 1.Oxycodone IR 5-15 mg q 4 hours PRN 2. Morphine 2 mg q 2 hours PRN 3. Robaxin 500 mg q 6 hours PRN 4. Neurontin 100 mg TID 5. Tylenol 650 mg q 6 hours scheduled   VTE  prophylaxis: Aspirin 325 mg  ID: Ceftriaxone 2gm post operatively  Foley/Lines: No foley, KVO IVFs  Medical co-morbidities: None, patient is otherwise healthy 53 year old female   Dispo: dispo pending, will plan to remove wound vac in next 1-2 days and assess skin viability. PT eval today  Follow - up plan: TBD   Katelyn Chapman A. Ladonna Snide [facility] ?[phone]? (phone)

## 2018-07-25 NOTE — Progress Notes (Signed)
Orthopedic Tech Progress Note Patient Details:  Katelyn Chapman 03-10-1966 [id number]  Ortho Devices Type of Ortho Device: Postop shoe/boot Ortho Device/Splint Interventions: Application   Post Interventions Patient Tolerated: Well Instructions Provided: Care of device   Saul Fordyce 07/25/2018, 11:30 AM

## 2018-07-25 NOTE — Evaluation (Signed)
Physical Therapy Evaluation Patient Details Name: Katelyn Chapman MRN: [id number] DOB: 1965-06-30 Today's Date: 07/25/2018   History of Present Illness  Pt is a 53 yo female admitted due to crush injury of L foot with traumatic amputation of toes 2-5, surgical fixation of big toe. Insignficant PMH.  Clinical Impression  Pt received in bed, willing to participate in therapy. Utilized Education officer, environmental via video call today. Overall functional mobility is Mod(I) to min guard (see details below). Education provided on NWB LLE and pt able to demonstrate understanding today. She will continue to benefit from skilled acute PT services in order to maximize her independence and improve her functional mobility to allow safe DC home.  Recommending outpatient PT because pt may have strength and mobility deficits after this traumatic injury, and outpatient PT will help to ensure she is able to get to a level of functional mobility so that she may return to work.    Follow Up Recommendations Outpatient PT    Equipment Recommendations  Rolling walker with 5" wheels(pediatric RW)    Recommendations for Other Services       Precautions / Restrictions Precautions Precautions: Fall Restrictions Weight Bearing Restrictions: Yes LLE Weight Bearing: Non weight bearing      Mobility  Bed Mobility Overal bed mobility: Modified Independent             General bed mobility comments: Pt able to get out of bed without assistance, HOB elevated  Transfers Overall transfer level: Needs assistance Equipment used: Rolling walker (2 wheeled) Transfers: Sit to/from Stand Sit to Stand: Min guard         General transfer comment: Pt able to stand from bed and chair with min guard for safety, maintains NWB on LLE well.  Ambulation/Gait Ambulation/Gait assistance: Min guard Gait Distance (Feet): 15 Feet Assistive device: Rolling walker (2 wheeled)   Gait velocity: decreased   General Gait  Details: Pt able to hop on R LE using RW for UE support with min guard for safety. Maintains NWB status well throughout hopping forwards, backwards and to the side.  Stairs Stairs: Yes Stairs assistance: Min guard Stair Management: No rails;With walker Number of Stairs: 1 General stair comments: 1 step up and down, using RW, min guard for safety  Wheelchair Mobility    Modified Rankin (Stroke Patients Only)       Balance Overall balance assessment: Needs assistance Sitting-balance support: No upper extremity supported;Feet supported Sitting balance-Leahy Scale: Normal     Standing balance support: During functional activity;Bilateral upper extremity supported Standing balance-Leahy Scale: Fair Standing balance comment: Pt able to maintain standing balance on one foot without UE support. Requires bil UE support for ambulation                             Pertinent Vitals/Pain Pain Assessment: No/denies pain(pt reports L foot is still numb)    Home Living Family/patient expects to be discharged to:: Private residence Living Arrangements: Spouse/significant other Available Help at Discharge: Family;Available PRN/intermittently Type of Home: House Home Access: Stairs to enter Entrance Stairs-Rails: None Entrance Stairs-Number of Steps: 1 Home Layout: One level Home Equipment: None      Prior Function Level of Independence: Independent         Comments: Pt was injured at work.     Hand Dominance        Extremity/Trunk Assessment   Upper Extremity Assessment Upper Extremity Assessment: Overall WFL for  tasks assessed    Lower Extremity Assessment Lower Extremity Assessment: Overall WFL for tasks assessed;LLE deficits/detail LLE Deficits / Details: L foot in gauze wrap, wound vac in place. Pt reports L foot is still numb.    Cervical / Trunk Assessment Cervical / Trunk Assessment: Normal  Communication   Communication: No difficulties   Cognition Arousal/Alertness: Awake/alert Behavior During Therapy: WFL for tasks assessed/performed Overall Cognitive Status: Within Functional Limits for tasks assessed                                        General Comments      Exercises     Assessment/Plan    PT Assessment Patient needs continued PT services  PT Problem List Pain;Decreased mobility;Decreased coordination;Decreased knowledge of use of DME       PT Treatment Interventions DME instruction;Functional mobility training;Balance training;Patient/family education;Gait training;Therapeutic activities;Neuromuscular re-education;Stair training;Therapeutic exercise    PT Goals (Current goals can be found in the Care Plan section)  Acute Rehab PT Goals Patient Stated Goal: go home PT Goal Formulation: With patient/family Time For Goal Achievement: 08/08/18 Potential to Achieve Goals: Good    Frequency Min 3X/week   Barriers to discharge        Co-evaluation               AM-PAC PT "6 Clicks" Mobility  Outcome Measure Help needed turning from your back to your side while in a flat bed without using bedrails?: None Help needed moving from lying on your back to sitting on the side of a flat bed without using bedrails?: None Help needed moving to and from a bed to a chair (including a wheelchair)?: None Help needed standing up from a chair using your arms (e.g., wheelchair or bedside chair)?: None Help needed to walk in hospital room?: None Help needed climbing 3-5 steps with a railing? : A Little 6 Click Score: 23    End of Session Equipment Utilized During Treatment: Gait belt Activity Tolerance: Patient tolerated treatment well Patient left: in chair;with call bell/phone within reach;with family/visitor present Nurse Communication: Mobility status PT Visit Diagnosis: Other abnormalities of gait and mobility (R26.89);Pain Pain - Right/Left: Left Pain - part of body: Ankle and joints of  foot    Time:  -      Charges:              Halina Andreas, SPT   Halina Andreas 07/25/2018, 4:04 PM

## 2018-07-26 MED ORDER — ACETAMINOPHEN 500 MG PO TABS: 500.0000 mg | ORAL_TABLET | Freq: Two times a day (BID) | ORAL | 1 refills | Status: DC

## 2018-07-26 MED ORDER — ASPIRIN 325 MG PO TABS: 325.0000 mg | ORAL_TABLET | Freq: Every day | ORAL | 0 refills | Status: DC

## 2018-07-26 MED ORDER — GABAPENTIN 100 MG PO CAPS: 100.0000 mg | ORAL_CAPSULE | Freq: Three times a day (TID) | ORAL | 0 refills | Status: DC

## 2018-07-26 MED ORDER — OXYCODONE HCL 5 MG PO TABS: 5.0000 mg | ORAL_TABLET | ORAL | 0 refills | Status: DC | PRN

## 2018-07-26 MED ORDER — METHOCARBAMOL 750 MG PO TABS: 750.0000 mg | ORAL_TABLET | Freq: Four times a day (QID) | ORAL | 0 refills | Status: DC | PRN

## 2018-07-26 MED FILL — ACETAMINOPHEN EXTRA STRENGT: 500 | 30 days supply | Qty: 60 | Fill #0

## 2018-07-26 MED FILL — METHOCARBAMOL 500 MG TABLET: 500 | 7 days supply | Qty: 42 | Fill #0

## 2018-07-26 MED FILL — oxyCODONE HCL 5 MG TABS: 5 | 4 days supply | Qty: 42 | Fill #0

## 2018-07-26 MED FILL — GABAPENTIN 100 MG CAPSULE: 100 | 7 days supply | Qty: 21 | Fill #0

## 2018-07-26 MED FILL — ASPIRIN EC 325 MG TABLET: 325 | 30 days supply | Qty: 30 | Fill #0

## 2018-07-26 NOTE — Discharge Instructions (Signed)
Orthopaedic Trauma Service Discharge Instructions   General Discharge Instructions  WEIGHT BEARING STATUS: non-weightbearing left lower extremity  RANGE OF MOTION/ACTIVITY: unrestricted range of motion of left ankle and foot  Wound Care: Home health nurse will come to house to change dressing on left foot every 2 days  DVT/PE prophylaxis: Aspirin 325 mg  Diet: as you were eating previously.  Can use over the counter stool softeners and bowel preparations, such as Miralax, to help with bowel movements.  Narcotics can be constipating.  Be sure to drink plenty of fluids  PAIN MEDICATION USE AND EXPECTATIONS  You have likely been given narcotic medications to help control your pain.  After a traumatic event that results in an fracture (broken bone) with or without surgery, it is ok to use narcotic pain medications to help control one's pain.  We understand that everyone responds to pain differently and each individual patient will be evaluated on a regular basis for the continued need for narcotic medications. Ideally, narcotic medication use should last no more than 6-8 weeks (coinciding with fracture healing).   As a patient it is your responsibility as well to monitor narcotic medication use and report the amount and frequency you use these medications when you come to your office visit.   We would also advise that if you are using narcotic medications, you should take a dose prior to therapy to maximize you participation.  IF YOU ARE ON NARCOTIC MEDICATIONS IT IS NOT PERMISSIBLE TO OPERATE A MOTOR VEHICLE (MOTORCYCLE/CAR/TRUCK/MOPED) OR HEAVY MACHINERY DO NOT MIX NARCOTICS WITH OTHER CNS (CENTRAL NERVOUS SYSTEM) DEPRESSANTS SUCH AS ALCOHOL   STOP SMOKING OR USING NICOTINE PRODUCTS!!!!  As discussed nicotine severely impairs your body's ability to heal surgical and traumatic wounds but also impairs bone healing.  Wounds and bone heal by forming microscopic blood vessels (angiogenesis) and  nicotine is a vasoconstrictor (essentially, shrinks blood vessels).  Therefore, if vasoconstriction occurs to these microscopic blood vessels they essentially disappear and are unable to deliver necessary nutrients to the healing tissue.  This is one modifiable factor that you can do to dramatically increase your chances of healing your injury.    (This means no smoking, no nicotine gum, patches, etc)  DO NOT USE NONSTEROIDAL ANTI-INFLAMMATORY DRUGS (NSAID'S)  Using products such as Advil (ibuprofen), Aleve (naproxen), Motrin (ibuprofen) for additional pain control during fracture healing can delay and/or prevent the healing response.  If you would like to take over the counter (OTC) medication, Tylenol (acetaminophen) is ok.  However, some narcotic medications that are given for pain control contain acetaminophen as well. Therefore, you should not exceed more than 4000 mg of tylenol in a day if you do not have liver disease.  Also note that there are may OTC medicines, such as cold medicines and allergy medicines that my contain tylenol as well.  If you have any questions about medications and/or interactions please ask your doctor/PA or your pharmacist.      ICE AND ELEVATE INJURED/OPERATIVE EXTREMITY  Using ice and elevating the injured extremity above your heart can help with swelling and pain control.  Icing in a pulsatile fashion, such as 20 minutes on and 20 minutes off, can be followed.    Do not place ice directly on skin. Make sure there is a barrier between to skin and the ice pack.    Using frozen items such as frozen peas works well as the conform nicely to the are that needs to be iced.  USE AN ACE WRAP OR TED HOSE FOR SWELLING CONTROL  In addition to icing and elevation, Ace wraps or TED hose are used to help limit and resolve swelling.  It is recommended to use Ace wraps or TED hose until you are informed to stop.    When using Ace Wraps start the wrapping distally (farthest away from  the body) and wrap proximally (closer to the body)   Example: If you had surgery on your leg or thing and you do not have a splint on, start the ace wrap at the toes and work your way up to the thigh        If you had surgery on your upper extremity and do not have a splint on, start the ace wrap at your fingers and work your way up to the upper arm   CALL THE OFFICE WITH ANY QUESTIONS OR CONCERNS: [phone]      Discharge Pin Site Instructions  Dress pins daily with Kerlix roll starting on POD 2. Wrap the Kerlix so that it tamps the skin down around the pin-skin interface to prevent/limit motion of the skin relative to the pin.  (Pin-skin motion is the primary cause of pain and infection related to external fixator pin sites).  Remove any crust or coagulum that may obstruct drainage with a saline moistened gauze or soap and water.  After POD 3, if there is no discernable drainage on the pin site dressing, the interval for change can by increased to every other day.  You may shower with the fixator, cleaning all pin sites gently with soap and water.  If you have a surgical wound this needs to be completely dry and without drainage before showering.  The extremity can be lifted by the fixator to facilitate wound care and transfers.  Notify the office/Doctor if you experience increasing drainage, redness, or pain from a pin site, or if you notice purulent (thick, snot-like) drainage.  Discharge Wound Care Instructions  Do NOT apply any ointments, solutions or lotions to pin sites or surgical wounds.  These prevent needed drainage and even though solutions like hydrogen peroxide kill bacteria, they also damage cells lining the pin sites that help fight infection.  Applying lotions or ointments can keep the wounds moist and can cause them to breakdown and open up as well. This can increase the risk for infection. When in doubt call the office.  Surgical incisions should be dressed  daily.  If any drainage is noted, use one layer of adaptic, then gauze, Kerlix, and an ace wrap.  Once the incision is completely dry and without drainage, it may be left open to air out.  Showering may begin 36-48 hours later.  Cleaning gently with soap and water.  Traumatic wounds should be dressed daily as well.    One layer of adaptic, gauze, Kerlix, then ace wrap.  The adaptic can be discontinued once the draining has ceased    If you have a wet to dry dressing: wet the gauze with saline the squeeze as much saline out so the gauze is moist (not soaking wet), place moistened gauze over wound, then place a dry gauze over the moist one, followed by Kerlix wrap, then ace wrap.

## 2018-07-26 NOTE — Progress Notes (Signed)
Discharge instructions given to patient via google translate app on the iphone. Stratus interpretor was not working, pt went along in the discharge packet in spanish, pt spoke back via goggle translate and stated that she understood the instructions and what she was supposed to do. Dan Humphreys will be delivered to her house by her CM. Pt given supplies for her Pin Care, also called and left a message for Eulah Pont about the WOC . They want a RN to come and do the dressing change every 2 days. Will pass along the message to her.

## 2018-07-26 NOTE — Progress Notes (Signed)
Physical Therapy Treatment Patient Details Name: Katelyn Chapman MRN: [id number] DOB: Oct 20, 1965 Today's Date: 07/26/2018    History of Present Illness Pt is a 53 yo female admitted due to crush injury of L foot with traumatic amputation of toes 2-5, surgical fixation of big toe. Insignficant PMH.    PT Comments    Patient seen for mobility progression. Pt tolerated increased gait distance this session of 30 ft. Continues to be limited by pain with L LE in dependent position. Reviewed maintaining LE elevated. Pt is doing well maintaining NWB status. Continue to progress as tolerated.    Follow Up Recommendations  Outpatient PT     Equipment Recommendations  (youth size RW)    Recommendations for Other Services       Precautions / Restrictions Precautions Precautions: Fall Restrictions Weight Bearing Restrictions: Yes LLE Weight Bearing: Non weight bearing    Mobility  Bed Mobility Overal bed mobility: Independent                Transfers Overall transfer level: Needs assistance Equipment used: Rolling walker (2 wheeled) Transfers: Sit to/from Stand Sit to Stand: Supervision         General transfer comment: cues for safe hand placement  Ambulation/Gait Ambulation/Gait assistance: Supervision Gait Distance (Feet): 30 Feet Assistive device: Rolling walker (2 wheeled) Gait Pattern/deviations: Step-to pattern Gait velocity: decreased   General Gait Details: no LOB; pt does very well maintaining NWB status; gait distance limited by increased pain with L LE in dependent position   Stairs         General stair comments: pt reports feeling comfortable ascending step at home and declines practicing this session   Wheelchair Mobility    Modified Rankin (Stroke Patients Only)       Balance Overall balance assessment: Needs assistance Sitting-balance support: No upper extremity supported;Feet supported Sitting balance-Leahy Scale: Normal      Standing balance support: During functional activity;Bilateral upper extremity supported Standing balance-Leahy Scale: Poor                              Cognition Arousal/Alertness: Awake/alert Behavior During Therapy: WFL for tasks assessed/performed Overall Cognitive Status: Within Functional Limits for tasks assessed                                        Exercises      General Comments General comments (skin integrity, edema, etc.): husband present and [employer] video interpreter utilized throughout session      Pertinent Vitals/Pain Pain Assessment: Faces Faces Pain Scale: Hurts little more Pain Location: L foot Pain Descriptors / Indicators: Aching Pain Intervention(s): Limited activity within patient's tolerance;Monitored during session;Premedicated before session;Repositioned    Home Living                      Prior Function            PT Goals (current goals can now be found in the care plan section) Acute Rehab PT Goals Patient Stated Goal: go home Progress towards PT goals: Progressing toward goals    Frequency    Min 3X/week      PT Plan Current plan remains appropriate    Co-evaluation              AM-PAC PT "6 Clicks" Mobility   Outcome  Measure  Help needed turning from your back to your side while in a flat bed without using bedrails?: None Help needed moving from lying on your back to sitting on the side of a flat bed without using bedrails?: None Help needed moving to and from a bed to a chair (including a wheelchair)?: None Help needed standing up from a chair using your arms (e.g., wheelchair or bedside chair)?: None Help needed to walk in hospital room?: None Help needed climbing 3-5 steps with a railing? : A Little 6 Click Score: 23    End of Session Equipment Utilized During Treatment: Gait belt Activity Tolerance: Patient tolerated treatment well Patient left: in chair;with call bell/phone  within reach;with family/visitor present Nurse Communication: Mobility status PT Visit Diagnosis: Other abnormalities of gait and mobility (R26.89);Pain Pain - Right/Left: Left Pain - part of body: Ankle and joints of foot     Time: 3009-2330 PT Time Calculation (min) (ACUTE ONLY): 18 min  Charges:  $Gait Training: 8-22 mins                     Erline Levine, PTA Acute Rehabilitation Services Pager: [phone] Office: [phone]     Carolynne Edouard 07/26/2018, 1:04 PM

## 2018-07-26 NOTE — Plan of Care (Signed)
  Problem: Pain Managment: Goal: General experience of comfort will improve Outcome: Progressing   

## 2018-07-26 NOTE — Care Management Note (Addendum)
Case Management Note  Patient Details  Name: Katelyn Chapman MRN: [id number] Date of Birth: February 13, 1966  Subjective/Objective:                    Action/Plan:  DC needs fulfilled through Worker's Comp CM Eulah Pont RN BSN CCM [phone] Fax [phone] CM provided her with notes and orders for RW and RN. RW to be delivered to home today. HH RN to be set up by [employer]. She will have first visit on Friday. Meds filled by [facility] pharmacy, Marcelino Duster given their number to set up payment for meds. Spoke w MD who states that she will not need OP PT arranged.   Expected Discharge Date:                  Expected Discharge Plan:  Home w Home Health Services  In-House Referral:     Discharge planning Services  CM Consult  Post Acute Care Choice:  Durable Medical Equipment Choice offered to:  NA  DME Arranged:  Walker rolling DME Agency:     HH Arranged:    HH Agency:     Status of Service:  Completed, signed off  If discussed at [employer] of Stay Meetings, dates discussed:    Additional Comments:  Lawerance Sabal, RN 07/26/2018, 11:17 AM

## 2018-07-26 NOTE — Discharge Summary (Signed)
Orthopaedic Trauma Service (OTS) Discharge Summary   Patient ID: Katelyn Chapman MRN: [id number] DOB/AGE: 51-Aug-1967 51 y.o.  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 No pertinent past medical history.   Procedures Performed: 1. CPT 28820x3-Amputation of 2nd-4th toes at metatarsalphalangeal joint 2. CPT 11012-Irrigation and debridement of left 1st and 5th distal phalanx fractures 3. CPT 28496-Percutaneous fixation of left great toe distal phalanx fracture 4. CPT 28470-Closed treatment of 1st-3rd metatarsal fractures 5. CPT 12035-Intermediate repair of lacerations of foot, >20 cm total 6. 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 She had severe lacerations and partial amputation of the 2nd and 3rd toes at initial presentation. Imaging in the ED revealed comminuted fractures of metatarsals as well as phalanx fractures with partial amputations of the toes. She was taken to the operating room by Dr. Jena Gauss for urgent irrigation debridement with possible fixation versus revision amputation of the left foot and toes.  Unfortunately the toes were unable to be salvaged and he had to amputate the 2nd-4th toes at metatarsalphalangeal joint. He also performed a percutaneous fixation of left great toe distal phalanx fracture. She tolerated the procedure well and an incisional wound vac was placed over the area immediately following the surgery. She was placed in a hard sole post-op shoes. She was placed on Aspirin for DVT prophylaxis and made non-weightbearing on the left lower extremity. She began working with physical therapy starting on post-operative day #1.   On 07/26/2018, the patient was tolerating diet, working well with therapies, pain well controlled, vital signs stable, dressings clean, dry, intact and felt stable for discharge to home. Patient will follow up as below and knows to call with questions or concerns.     Consults: None  Significant Diagnostic Studies: None  Treatments: surgery: 1. CPT 28820x3-Amputation of 2nd-4th toes at metatarsalphalangeal joint 2. CPT 11012-Irrigation and debridement of left 1st and 5th distal phalanx fractures 3. CPT 28496-Percutaneous fixation of left great toe distal phalanx fracture 4. CPT 28470-Closed treatment of 1st-3rd metatarsal fractures 5. CPT 12035-Intermediate repair of lacerations of foot, >20 cm total 6. CPT 97605-Incisional wound vac placement  Discharge Exam: General - sitting up in bed, well appearing. In NAD. Alert and oriented. LLE - Ace wrap and wound vac removed.  Pin to 1st digit in place. About 75 mL output in wound vac canister. Incision is clean and intact. No erythema or signs of infection. No signs of skin necrosis. Mild to moderate tenderness to palpation of foot and area surrounding the incisions. Good dorsiflexion and plantarflexion without significant discomfort. Improved sensation to dorsal and plantar aspect of foot. +DP pulse.  Disposition: Home   Allergies as of 07/26/2018   No  Known Allergies     Medication List    TAKE these  medications   acetaminophen 500 MG tablet Commonly known as:  TYLENOL Take 1 tablet (500 mg total) by mouth every 12 (twelve) hours.   aspirin 325 MG tablet Take 1 tablet (325 mg total) by mouth daily. Start taking on:  July 27, 2018   gabapentin 100 MG capsule Commonly known as:  NEURONTIN Take 1 capsule (100 mg total) by mouth 3 (three) times daily.   methocarbamol 750 MG tablet Commonly known as:  ROBAXIN-750 Take 1 tablet (750 mg total) by mouth every 6 (six) hours as needed for muscle spasms.   oxyCODONE 5 MG immediate release tablet Commonly known as:  Oxy IR/ROXICODONE Take 1-2 tablets (5-10 mg total) by mouth every 4 (four) hours as needed (5mg  for mild pain, 10mg  for moderate to severe pain).            Durable Medical Equipment  (From admission, onward)         Start     Ordered   07/26/18 1009  For home use only DME Walker rolling  Once    Question:  Patient needs a walker to treat with the following condition  Answer:  Weakness   07/26/18 1008         Follow-up Information    Haddix, Gillie Manners, MD. Go on 08/01/2018.   Specialty:  Orthopedic Surgery Why:  08/01/18 at 08:30 Contact information: [address] [place] [address] [phone]        Rolling Walker Follow up.   Why:  Dan Humphreys will be delivered to your home by worker's comp           Discharge Instructions and Plan: Patient will be discharged to home. She will remain non-weightbearing on the left lower extremity with the use of a walker. She will continue with use of the post-op shoe when up out of bed. She will remain on Aspirin 325 mg daily for DVT prophylaxis. A home health nurse will come out to assist with dressing changes on Friday (07/28/18) and Monday (07/31/18). She will follow up with Dr. Jena Gauss in the outpatient clinic on Tuesday (08/11/18) for a wound recheck. All questions were answered to the patient's satisfaction.   Signed:  Shawn Route. Ladonna Snide ?[phone]?  (phone) 07/26/2018, 11:29 AM  [facility] [address] [place] [address] [phone] (F)

## 2018-07-27 NOTE — Care Management (Addendum)
Post DC note: 07/27/2018 08:45 Contacted by Crystal at [facility] [phone] ext [phone] Faxed H&P, DC note, and OP note to [phone] per her request.

## 2018-07-30 ENCOUNTER — Encounter (HOSPITAL_COMMUNITY): Payer: Self-pay | Admitting: Emergency Medicine

## 2018-07-30 ENCOUNTER — Other Ambulatory Visit: Payer: Self-pay

## 2018-07-30 ENCOUNTER — Emergency Department (HOSPITAL_COMMUNITY)
Admission: EM | Admit: 2018-07-30 | Discharge: 2018-07-30 | Disposition: A | Discharge status: Home / Self Care | Payer: Worker's Compensation | Attending: Emergency Medicine | Admitting: Emergency Medicine

## 2018-07-30 DIAGNOSIS — Z4801 Encounter for change or removal of surgical wound dressing: Secondary | ICD-10-CM | POA: Diagnosis present

## 2018-07-30 DIAGNOSIS — Z4800 Encounter for change or removal of nonsurgical wound dressing: Secondary | ICD-10-CM

## 2018-07-30 NOTE — ED Notes (Signed)
Patient verbalizes understanding of discharge instructions. Opportunity for questioning and answers were provided. Armband removed by staff, pt discharged from ED via wheelchair with husband.  

## 2018-07-30 NOTE — Discharge Instructions (Addendum)
Follow up with your orthopedic doctor as planned.

## 2018-07-30 NOTE — ED Provider Notes (Signed)
[facility] EMERGENCY DEPARTMENT Provider Note   CSN: [id number] Arrival date & time: 07/30/18  1305    History   Chief Complaint Chief Complaint  Patient presents with  . Post-op Problem    HPI Katelyn Chapman is a 53 y.o. female.     HPI The patient sustained a traumatic injury to her left foot on March 2 while she was at work.  Patient ended up having a crush injury to her left foot.  She had a traumatic amputation of toes and displaced fractures of the metatarsals.  Patient's husband states that they were told the nurse was going to come to the house to help change the dressing.  This morning when the husband was going to change the dressing there was crusted blood and it was too painful for his wife to take off the dressing so he came to the ED.  Patient has been taking her pain medications.  She continues to have pain in her foot.  She is not having any fevers or chills. History reviewed. No pertinent past medical history.  Patient Active Problem List   Diagnosis Date Noted  . Crush injury of left foot 07/24/2018  . 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 Displaced fracture of first metatarsal bone, left foot, initial encounter for open fracture 07/24/2018  . Displaced fracture of second metatarsal bone, left foot, initial encounter for open fracture 07/24/2018  . Displaced fracture of third metatarsal bone, left foot, initial encounter for open fracture 07/24/2018    Past Surgical History:  Procedure Laterality Date  . AMPUTATION TOE Left 07/24/2018   Procedure: AMPUTATION TOE;  Surgeon: Roby Lofts, MD;  Location: [facility] OR;  Service: Orthopedics;  Laterality: Left;  . I&D EXTREMITY Left 07/24/2018   Procedure: IRRIGATION AND DEBRIDEMENT EXTREMITY;  Surgeon: Roby Lofts, MD;  Location: [facility] OR;  Service: Orthopedics;  Laterality: Left;  . ORIF TOE FRACTURE Left 07/24/2018   Procedure: OPEN  REDUCTION INTERNAL FIXATION (ORIF) METATARSAL (TOE) FRACTURE;  Surgeon: Roby Lofts, MD;  Location: [facility] OR;  Service: Orthopedics;  Laterality: Left;     OB History   No obstetric history on file.      Home Medications    Prior to Admission medications   Medication Sig Start Date End Date Taking? Authorizing Provider  acetaminophen (TYLENOL) 500 MG tablet Take 1 tablet (500 mg total) by mouth every 12 (twelve) hours. 07/26/18   Despina Hidden, PA-C  aspirin 325 MG tablet Take 1 tablet (325 mg total) by mouth daily. 07/27/18   Despina Hidden, PA-C  gabapentin (NEURONTIN) 100 MG capsule Take 1 capsule (100 mg total) by mouth 3 (three) times daily. 07/26/18   Despina Hidden, PA-C  methocarbamol (ROBAXIN-750) 750 MG tablet Take 1 tablet (750 mg total) by mouth every 6 (six) hours as needed for muscle spasms. 07/26/18   Despina Hidden, PA-C  oxyCODONE (OXY IR/ROXICODONE) 5 MG immediate release tablet Take 1-2 tablets (5-10 mg total) by mouth every 4 (four) hours as needed (5mg  for mild pain, 10mg  for moderate to severe pain). 07/26/18   Despina Hidden, PA-C    Family History History reviewed. No pertinent family history.  Social History Social History   Tobacco Use  . Smoking status: Never Smoker  . Smokeless tobacco: Never Used  Substance Use Topics  . Alcohol use: Never    Frequency: Never  . Drug use:  Never     Allergies   Patient has no known allergies.   Review of Systems Review of Systems  All other systems reviewed and are negative.    Physical Exam Updated Vital Signs BP 118/81 (BP Location: Right Arm)   Pulse 86   Temp 98.4 F (36.9 C) (Oral)   Resp 17   Ht 1.524 m (5')   Wt 68 kg   LMP  (LMP Unknown)   SpO2 96%   BMI 29.29 kg/m   Physical Exam Vitals signs and nursing note reviewed.  Constitutional:      General: She is not in acute distress.    Appearance: She is well-developed.  HENT:     Head: Normocephalic and atraumatic.     Right Ear:  External ear normal.     Left Ear: External ear normal.  Eyes:     General: No scleral icterus.       Right eye: No discharge.        Left eye: No discharge.     Conjunctiva/sclera: Conjunctivae normal.  Neck:     Musculoskeletal: Neck supple.     Trachea: No tracheal deviation.  Cardiovascular:     Rate and Rhythm: Normal rate.  Pulmonary:     Effort: Pulmonary effort is normal. No respiratory distress.     Breath sounds: No stridor.  Abdominal:     General: There is no distension.  Musculoskeletal:        General: No swelling or deformity.     Left foot: Tenderness present.     Comments: External fixator pin noted in the left big toe, status post amputation of the digits, surgical wounds without evidence of erythema, expected postoperative bruising and swelling noted  Skin:    General: Skin is warm and dry.     Findings: No rash.  Neurological:     Mental Status: She is alert.     Cranial Nerves: Cranial nerve deficit: no gross deficits.      ED Treatments / Results    Procedures Procedures (including critical care time) The patient's dressing was removed using gentle irrigation.  Patient tolerated this well Medications Ordered in ED Medications - No data to display   Initial Impression / Assessment and Plan / ED Course  I have reviewed the triage vital signs and the nursing notes.  Pertinent labs & imaging results that were available during my care of the patient were reviewed by me and considered in my medical decision making (see chart for details).   No signs of infection.  Dressing was removed without difficulty.  Plan on reapplying a sterile dressing.  Outpatient follow-up as planned.  Final Clinical Impressions(s) / ED Diagnoses   Final diagnoses:  Dressing change    ED Discharge Orders    None       Linwood Dibbles, MD 07/30/18 1427

## 2018-07-30 NOTE — Care Management (Addendum)
Post DC note: 07/30/2018 13:00 Patient's family at nurse's station stating that they never received a home health nurse on Friday 3/6. Spouse states that he tried to call worker's comp CM- Eulah Pont RN BSN CCM [phone] several times and never got a call back. He brought patient to the hospital today because he is concerned that her foot looks swollen.  CM called [facility] ([employer] home health referral company used by worker's comp to arrange local home health company to see patient) at [phone]. Spoke w Pam who stated that she can see in the system that the patient was referred to a home health company but that the case was not "completed" she could not provide name of home health company they referred to. Patient's spouse stated that foot was swollen. CM advised him to have patient seen in ED to change dressing and have site assessed.  Reviewed with spouse that patient has follow up with Ortho on 3/10. 13:20 Pam from [facility] called back to give update that case was not started because they could not reach patient on the phone to verify address. CM confirmed with her over the phone that address [facility] had on file was accurate. They will now attempt to set up [facility] RN to visit tomorrow.

## 2018-07-30 NOTE — ED Triage Notes (Signed)
The patient was seen here on Monday due to a foot injury and was discharged to home.  Husband says a nurse would come to their home to change her dressing and no one has come to their house to change it.  The husband tried to changed the dressing due to it being bloody but the dressing was stuck to to the wound.  He just wants someone to look at the wound and change the dressing so that it does not get infected.  The patient rates her pain 5/10 and was prescribed oxycodone for pain and has been taking them PRN.  No other complaints.

## 2018-08-01 ENCOUNTER — Encounter (INDEPENDENT_AMBULATORY_CARE_PROVIDER_SITE_OTHER): Payer: Self-pay | Admitting: Orthopedic Surgery

## 2018-08-01 ENCOUNTER — Encounter (HOSPITAL_COMMUNITY): Payer: Self-pay | Admitting: *Deleted

## 2018-08-01 ENCOUNTER — Ambulatory Visit (INDEPENDENT_AMBULATORY_CARE_PROVIDER_SITE_OTHER): Payer: Self-pay | Admitting: Physician Assistant

## 2018-08-01 ENCOUNTER — Ambulatory Visit (INDEPENDENT_AMBULATORY_CARE_PROVIDER_SITE_OTHER): Payer: Worker's Compensation | Admitting: Orthopedic Surgery

## 2018-08-01 ENCOUNTER — Other Ambulatory Visit: Payer: Self-pay

## 2018-08-01 VITALS — Ht 60.0 in | Wt 150.0 lb

## 2018-08-01 DIAGNOSIS — S9782XS Crushing injury of left foot, sequela: Secondary | ICD-10-CM

## 2018-08-01 DIAGNOSIS — S93325S Dislocation of tarsometatarsal joint of left foot, sequela: Secondary | ICD-10-CM

## 2018-08-01 NOTE — Progress Notes (Signed)
Office Visit Note   Patient: Katelyn Chapman           Date of Birth: 04/06/1966           MRN: [id number] Visit Date: 08/01/2018              Requested by: Oswaldo Conroy, MD [address] [place], [place] [address] PCP: Oswaldo Conroy, MD  Chief Complaint  Patient presents with  . Left Foot - Routine Post Op, Follow-up, Pain    NP; 2nd opinion;possible surgery      HPI: Patient is a 53 year old woman who is seen for initial evaluation in referral from Dr. Dan Maker for crush injury to her left foot.  Patient's foot was run over by a forklift truck.  She underwent urgent surgical intervention with amputation of toes 2 through 4 pinning of the first ray.  At home patient was supposed to be followed up with home health nursing but this did not happen and family went to the emergency room on Sunday for wound evaluation due to worsening of the appearance of the dressing and increasing pain.  Past medical history negative for diabetes negative for hypertension negative for high cholesterol negative for tobacco use.  Assessment & Plan: Visit Diagnoses:  1. Crushing injury of left foot, sequela   2. Lisfranc dislocation, left, sequela     Plan: Discussed with the patient her family through the interpreter with case management present that she has a critical injury to the left forefoot.  Discussed that we will proceed to the operating room tomorrow for debridement of the necrotic tissue application of an installation wound VAC with return to the operating room on Friday at which time anticipate that we would also amputate the great toe for tissue transfer to cover the area of necrotic tissue and would do the same with amputation of the little toe with application of split-thickness skin graft and a incisional wound VAC.  Also discussed that with the instability of the Lisfranc complex would plan for percutaneous screw fixation for the Lisfranc complex.  Risks and  benefits were discussed all questions were encouraged and answered.  Anticipate patient will be in the hospital 4 to 5 days.  Follow-Up Instructions: Return in about 1 week (around 08/08/2018).   Ortho Exam  Patient is alert, oriented, no adenopathy, well-dressed, normal affect, normal respiratory effort. Examination patient has a good dorsalis pedis and posterior tibial pulse she does have swelling to the foot and has pain to light palpation.  The forefoot is black and gangrenous.  The great toe and little toe are viable the ischemic changes involve both the plantar and dorsal aspect of the forefoot.  Review of the radiographs shows a fracture through the base of the first metatarsal consistent with a unstable Lisfranc complex.  Imaging: No results found.   Labs: No results found for: HGBA1C, ESRSEDRATE, CRP, LABURIC, REPTSTATUS, GRAMSTAIN, CULT, LABORGA   Lab Results  Component Value Date   ALBUMIN 4.1 07/24/2018    Body mass index is 29.29 kg/m.  Orders:  No orders of the defined types were placed in this encounter.  No orders of the defined types were placed in this encounter.    Procedures: No procedures performed  Clinical Data: No additional findings.  ROS:  All other systems negative, except as noted in the HPI. Review of Systems  Objective: Vital Signs: Ht 5' (1.524 m)   Wt 150 lb (68 kg)   LMP  (  LMP Unknown)   BMI 29.29 kg/m   Specialty Comments:  No specialty comments available.  PMFS History: Patient Active Problem List   Diagnosis Date Noted  . Crush injury of left foot 07/24/2018  . 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 Displaced fracture of first metatarsal bone, left foot, initial encounter for open fracture 07/24/2018  . Displaced fracture of second metatarsal bone, left foot, initial encounter for open fracture 07/24/2018  . Displaced fracture of third metatarsal bone, left foot,  initial encounter for open fracture 07/24/2018   History reviewed. No pertinent past medical history.  History reviewed. No pertinent family history.  Past Surgical History:  Procedure Laterality Date  . AMPUTATION TOE Left 07/24/2018   Procedure: AMPUTATION TOE;  Surgeon: Roby Lofts, MD;  Location: [facility] OR;  Service: Orthopedics;  Laterality: Left;  . I&D EXTREMITY Left 07/24/2018   Procedure: IRRIGATION AND DEBRIDEMENT EXTREMITY;  Surgeon: Roby Lofts, MD;  Location: [facility] OR;  Service: Orthopedics;  Laterality: Left;  . ORIF TOE FRACTURE Left 07/24/2018   Procedure: OPEN REDUCTION INTERNAL FIXATION (ORIF) METATARSAL (TOE) FRACTURE;  Surgeon: Roby Lofts, MD;  Location: [facility] OR;  Service: Orthopedics;  Laterality: Left;   Social History   Occupational History  . Not on file  Tobacco Use  . Smoking status: Never Smoker  . Smokeless tobacco: Never Used  Substance and Sexual Activity  . Alcohol use: Never    Frequency: Never  . Drug use: Never  . Sexual activity: Not on file

## 2018-08-02 ENCOUNTER — Encounter (HOSPITAL_COMMUNITY)
Admission: RE | Disposition: A | Discharge status: Home / Self Care | Payer: Self-pay | Source: Ambulatory Visit | Attending: Orthopedic Surgery

## 2018-08-02 ENCOUNTER — Other Ambulatory Visit: Payer: Self-pay

## 2018-08-02 ENCOUNTER — Encounter (HOSPITAL_COMMUNITY): Payer: Self-pay

## 2018-08-02 ENCOUNTER — Inpatient Hospital Stay (HOSPITAL_COMMUNITY): Payer: Worker's Compensation | Admitting: Anesthesiology

## 2018-08-02 ENCOUNTER — Inpatient Hospital Stay (HOSPITAL_COMMUNITY)
Admission: RE | Admit: 2018-08-02 | Discharge: 2018-08-07 | DRG: 904 | Disposition: A | Discharge status: Home / Self Care | Payer: Worker's Compensation | Source: Ambulatory Visit | Attending: Orthopedic Surgery | Admitting: Orthopedic Surgery

## 2018-08-02 DIAGNOSIS — S93325S Dislocation of tarsometatarsal joint of left foot, sequela: Secondary | ICD-10-CM

## 2018-08-02 DIAGNOSIS — S93322A Subluxation of tarsometatarsal joint of left foot, initial encounter: Secondary | ICD-10-CM | POA: Diagnosis present

## 2018-08-02 DIAGNOSIS — W230XXA Caught, crushed, jammed, or pinched between moving objects, initial encounter: Secondary | ICD-10-CM | POA: Diagnosis present

## 2018-08-02 DIAGNOSIS — S9782XA Crushing injury of left foot, initial encounter: Secondary | ICD-10-CM | POA: Diagnosis present

## 2018-08-02 DIAGNOSIS — I96 Gangrene, not elsewhere classified: Secondary | ICD-10-CM

## 2018-08-02 DIAGNOSIS — S9782XS Crushing injury of left foot, sequela: Secondary | ICD-10-CM | POA: Diagnosis not present

## 2018-08-02 HISTORY — PX: I & D EXTREMITY: SHX5045

## 2018-08-02 HISTORY — DX: Other specified health status: Z78.9

## 2018-08-02 LAB — POCT PREGNANCY, URINE: Preg Test, Ur: NEGATIVE

## 2018-08-02 SURGERY — IRRIGATION AND DEBRIDEMENT EXTREMITY
Anesthesia: General | Site: Foot | Laterality: Left

## 2018-08-02 MED ORDER — LACTATED RINGERS IV SOLN
INTRAVENOUS | Status: DC
  Administered 2018-08-02 (×3): via INTRAVENOUS

## 2018-08-02 MED ORDER — OXYCODONE HCL 5 MG PO TABS
10.0000 mg | ORAL_TABLET | ORAL | Status: DC | PRN
  Administered 2018-08-05: 10 mg via ORAL
  Administered 2018-08-05: 15 mg via ORAL
  Administered 2018-08-06: 10 mg via ORAL
  Administered 2018-08-06: 15 mg via ORAL
  Filled 2018-08-02 (×2): qty 3
  Filled 2018-08-02: qty 2

## 2018-08-02 MED ORDER — PROPOFOL 10 MG/ML IV BOLUS
INTRAVENOUS | Status: DC | PRN
  Administered 2018-08-02: 120 mg via INTRAVENOUS

## 2018-08-02 MED ORDER — 0.9 % SODIUM CHLORIDE (POUR BTL) OPTIME
TOPICAL | Status: DC | PRN
  Administered 2018-08-02: 1000 mL

## 2018-08-02 MED ORDER — METHOCARBAMOL 1000 MG/10ML IJ SOLN
500.0000 mg | Freq: Four times a day (QID) | INTRAVENOUS | Status: DC | PRN
  Filled 2018-08-02: qty 5

## 2018-08-02 MED ORDER — ACETAMINOPHEN 325 MG PO TABS
325.0000 mg | ORAL_TABLET | Freq: Four times a day (QID) | ORAL | Status: DC | PRN
  Filled 2018-08-02: qty 2

## 2018-08-02 MED ORDER — HYDROMORPHONE HCL 1 MG/ML IJ SOLN
INTRAMUSCULAR | Status: AC
  Filled 2018-08-02: qty 0.5

## 2018-08-02 MED ORDER — FENTANYL CITRATE (PF) 100 MCG/2ML IJ SOLN
25.0000 ug | INTRAMUSCULAR | Status: DC | PRN
  Administered 2018-08-02 (×2): 50 ug via INTRAVENOUS

## 2018-08-02 MED ORDER — POLYETHYLENE GLYCOL 3350 17 G PO PACK: 17.0000 g | PACK | Freq: Every day | ORAL | Status: DC | PRN

## 2018-08-02 MED ORDER — METOCLOPRAMIDE HCL 5 MG/ML IJ SOLN: 5.0000 mg | Freq: Three times a day (TID) | INTRAMUSCULAR | Status: DC | PRN

## 2018-08-02 MED ORDER — HYDROMORPHONE HCL 1 MG/ML IJ SOLN
0.5000 mg | INTRAMUSCULAR | Status: DC | PRN
  Administered 2018-08-03 – 2018-08-06 (×2): 1 mg via INTRAVENOUS
  Filled 2018-08-02 (×2): qty 1

## 2018-08-02 MED ORDER — ONDANSETRON HCL 4 MG/2ML IJ SOLN: 4.0000 mg | Freq: Four times a day (QID) | INTRAMUSCULAR | Status: DC | PRN

## 2018-08-02 MED ORDER — BISACODYL 10 MG RE SUPP: 10.0000 mg | Freq: Every day | RECTAL | Status: DC | PRN

## 2018-08-02 MED ORDER — LIDOCAINE 2% (20 MG/ML) 5 ML SYRINGE
INTRAMUSCULAR | Status: DC | PRN
  Administered 2018-08-02: 40 mg via INTRAVENOUS

## 2018-08-02 MED ORDER — PHENYLEPHRINE HCL 10 MG/ML IJ SOLN
INTRAMUSCULAR | Status: DC | PRN
  Administered 2018-08-02 (×2): 80 ug via INTRAVENOUS

## 2018-08-02 MED ORDER — OXYCODONE HCL 5 MG/5ML PO SOLN: 5.0000 mg | Freq: Once | ORAL | Status: AC | PRN

## 2018-08-02 MED ORDER — ONDANSETRON HCL 4 MG PO TABS: 4.0000 mg | ORAL_TABLET | Freq: Four times a day (QID) | ORAL | Status: DC | PRN

## 2018-08-02 MED ORDER — FENTANYL CITRATE (PF) 250 MCG/5ML IJ SOLN
INTRAMUSCULAR | Status: AC
  Filled 2018-08-02: qty 5

## 2018-08-02 MED ORDER — MAGNESIUM CITRATE PO SOLN: 1.0000 | Freq: Once | ORAL | Status: DC | PRN

## 2018-08-02 MED ORDER — MIDAZOLAM HCL 5 MG/5ML IJ SOLN
INTRAMUSCULAR | Status: DC | PRN
  Administered 2018-08-02: 2 mg via INTRAVENOUS

## 2018-08-02 MED ORDER — METOCLOPRAMIDE HCL 5 MG PO TABS: 5.0000 mg | ORAL_TABLET | Freq: Three times a day (TID) | ORAL | Status: DC | PRN

## 2018-08-02 MED ORDER — ASPIRIN 325 MG PO TABS
325.0000 mg | ORAL_TABLET | Freq: Every day | ORAL | Status: DC
  Administered 2018-08-02 – 2018-08-07 (×6): 325 mg via ORAL
  Filled 2018-08-02 (×6): qty 1

## 2018-08-02 MED ORDER — ONDANSETRON HCL 4 MG/2ML IJ SOLN
INTRAMUSCULAR | Status: DC | PRN
  Administered 2018-08-02: 4 mg via INTRAVENOUS

## 2018-08-02 MED ORDER — SODIUM CHLORIDE 0.9 % IV SOLN: INTRAVENOUS | Status: DC

## 2018-08-02 MED ORDER — DEXAMETHASONE SODIUM PHOSPHATE 10 MG/ML IJ SOLN
INTRAMUSCULAR | Status: DC | PRN
  Administered 2018-08-02: 10 mg via INTRAVENOUS

## 2018-08-02 MED ORDER — CHLORHEXIDINE GLUCONATE 4 % EX LIQD: 60.0000 mL | Freq: Once | CUTANEOUS | Status: DC

## 2018-08-02 MED ORDER — METHOCARBAMOL 500 MG PO TABS
ORAL_TABLET | ORAL | Status: AC
  Administered 2018-08-02: 500 mg via ORAL
  Filled 2018-08-02: qty 1

## 2018-08-02 MED ORDER — CEFAZOLIN SODIUM-DEXTROSE 1-4 GM/50ML-% IV SOLN
1.0000 g | Freq: Four times a day (QID) | INTRAVENOUS | Status: DC
  Administered 2018-08-02 – 2018-08-07 (×19): 1 g via INTRAVENOUS
  Filled 2018-08-02 (×20): qty 50

## 2018-08-02 MED ORDER — GABAPENTIN 100 MG PO CAPS
100.0000 mg | ORAL_CAPSULE | Freq: Three times a day (TID) | ORAL | Status: DC
  Administered 2018-08-02 – 2018-08-07 (×14): 100 mg via ORAL
  Filled 2018-08-02 (×14): qty 1

## 2018-08-02 MED ORDER — MIDAZOLAM HCL 2 MG/2ML IJ SOLN
INTRAMUSCULAR | Status: AC
  Filled 2018-08-02: qty 2

## 2018-08-02 MED ORDER — PROPOFOL 10 MG/ML IV BOLUS
INTRAVENOUS | Status: AC
  Filled 2018-08-02: qty 20

## 2018-08-02 MED ORDER — CEFAZOLIN SODIUM-DEXTROSE 2-4 GM/100ML-% IV SOLN
2.0000 g | INTRAVENOUS | Status: AC
  Administered 2018-08-02: 2 g via INTRAVENOUS
  Filled 2018-08-02: qty 100

## 2018-08-02 MED ORDER — OXYCODONE HCL 5 MG PO TABS
5.0000 mg | ORAL_TABLET | Freq: Once | ORAL | Status: AC | PRN
  Administered 2018-08-02: 5 mg via ORAL

## 2018-08-02 MED ORDER — FENTANYL CITRATE (PF) 100 MCG/2ML IJ SOLN
INTRAMUSCULAR | Status: AC
  Filled 2018-08-02: qty 2

## 2018-08-02 MED ORDER — OXYCODONE HCL 5 MG PO TABS
5.0000 mg | ORAL_TABLET | ORAL | Status: DC | PRN
  Administered 2018-08-02: 10 mg via ORAL
  Administered 2018-08-03: 5 mg via ORAL
  Administered 2018-08-03 – 2018-08-07 (×2): 10 mg via ORAL
  Filled 2018-08-02 (×3): qty 2
  Filled 2018-08-02: qty 1
  Filled 2018-08-02: qty 2

## 2018-08-02 MED ORDER — METHOCARBAMOL 500 MG PO TABS
500.0000 mg | ORAL_TABLET | Freq: Four times a day (QID) | ORAL | Status: DC | PRN
  Administered 2018-08-02 – 2018-08-03 (×3): 500 mg via ORAL
  Filled 2018-08-02 (×2): qty 1

## 2018-08-02 MED ORDER — DOCUSATE SODIUM 100 MG PO CAPS
100.0000 mg | ORAL_CAPSULE | Freq: Two times a day (BID) | ORAL | Status: DC
  Administered 2018-08-02 – 2018-08-03 (×3): 100 mg via ORAL
  Filled 2018-08-02 (×3): qty 1

## 2018-08-02 MED ORDER — FENTANYL CITRATE (PF) 250 MCG/5ML IJ SOLN
INTRAMUSCULAR | Status: DC | PRN
  Administered 2018-08-02 (×2): 50 ug via INTRAVENOUS
  Administered 2018-08-02 (×2): 25 ug via INTRAVENOUS
  Administered 2018-08-02: 50 ug via INTRAVENOUS

## 2018-08-02 MED ORDER — OXYCODONE HCL 5 MG PO TABS
ORAL_TABLET | ORAL | Status: AC
  Administered 2018-08-02: 5 mg via ORAL
  Filled 2018-08-02: qty 1

## 2018-08-02 SURGICAL SUPPLY — 39 items
BLADE SURG 21 STRL SS (BLADE) ×3 IMPLANT
BNDG COHESIVE 6X5 TAN STRL LF (GAUZE/BANDAGES/DRESSINGS) ×2 IMPLANT
BNDG GAUZE ELAST 4 BULKY (GAUZE/BANDAGES/DRESSINGS) ×2 IMPLANT
CANISTER WOUNDNEG PRESSURE 500 (CANNISTER) ×2 IMPLANT
CASSETTE VERAFLO VERALINK (MISCELLANEOUS) ×2 IMPLANT
COVER SURGICAL LIGHT HANDLE (MISCELLANEOUS) ×6 IMPLANT
COVER WAND RF STERILE (DRAPES) ×3 IMPLANT
DRAPE INCISE IOBAN 85X60 (DRAPES) ×2 IMPLANT
DRAPE U-SHAPE 47X51 STRL (DRAPES) ×3 IMPLANT
DRESSING VERAFLO CLEANSE CC (GAUZE/BANDAGES/DRESSINGS) IMPLANT
DRSG ADAPTIC 3X8 NADH LF (GAUZE/BANDAGES/DRESSINGS) ×3 IMPLANT
DRSG VERAFLO CLEANSE CC (GAUZE/BANDAGES/DRESSINGS) ×3
DURAPREP 26ML APPLICATOR (WOUND CARE) ×3 IMPLANT
ELECT REM PT RETURN 9FT ADLT (ELECTROSURGICAL)
ELECTRODE REM PT RTRN 9FT ADLT (ELECTROSURGICAL) IMPLANT
GAUZE SPONGE 4X4 12PLY STRL (GAUZE/BANDAGES/DRESSINGS) ×3 IMPLANT
GLOVE BIOGEL PI IND STRL 9 (GLOVE) ×1 IMPLANT
GLOVE BIOGEL PI INDICATOR 9 (GLOVE) ×2
GLOVE SURG ORTHO 9.0 STRL STRW (GLOVE) ×3 IMPLANT
GOWN STRL REUS W/ TWL LRG LVL3 (GOWN DISPOSABLE) IMPLANT
GOWN STRL REUS W/ TWL XL LVL3 (GOWN DISPOSABLE) ×2 IMPLANT
GOWN STRL REUS W/TWL LRG LVL3 (GOWN DISPOSABLE) ×4
GOWN STRL REUS W/TWL XL LVL3 (GOWN DISPOSABLE) ×2
HANDPIECE INTERPULSE COAX TIP (DISPOSABLE)
KIT BASIN OR (CUSTOM PROCEDURE TRAY) ×3 IMPLANT
KIT TURNOVER KIT B (KITS) ×3 IMPLANT
MANIFOLD NEPTUNE II (INSTRUMENTS) ×3 IMPLANT
NS IRRIG 1000ML POUR BTL (IV SOLUTION) ×3 IMPLANT
PACK ORTHO EXTREMITY (CUSTOM PROCEDURE TRAY) ×3 IMPLANT
PAD ARMBOARD 7.5X6 YLW CONV (MISCELLANEOUS) ×6 IMPLANT
SET HNDPC FAN SPRY TIP SCT (DISPOSABLE) IMPLANT
SPONGE LAP 18X18 X RAY DECT (DISPOSABLE) ×2 IMPLANT
STOCKINETTE IMPERVIOUS 9X36 MD (GAUZE/BANDAGES/DRESSINGS) IMPLANT
SWAB COLLECTION DEVICE MRSA (MISCELLANEOUS) ×3 IMPLANT
SWAB CULTURE ESWAB REG 1ML (MISCELLANEOUS) ×2 IMPLANT
TOWEL OR 17X26 10 PK STRL BLUE (TOWEL DISPOSABLE) ×3 IMPLANT
TUBE CONNECTING 12'X1/4 (SUCTIONS) ×1
TUBE CONNECTING 12X1/4 (SUCTIONS) ×2 IMPLANT
YANKAUER SUCT BULB TIP NO VENT (SUCTIONS) ×3 IMPLANT

## 2018-08-02 NOTE — Progress Notes (Signed)
Katelyn Chapman with interpreter services called and stated that an interpreter has been confirmed to arrive at 1300 for pre-op and PACU.

## 2018-08-02 NOTE — Op Note (Signed)
08/02/2018  5:00 PM  PATIENT:  Garen Lah    PRE-OPERATIVE DIAGNOSIS:  Crush Injury Left Foot  POST-OPERATIVE DIAGNOSIS:  Same  PROCEDURE:  DEBRIDEMENT LEFT FOOT, with excision skin and soft tissue muscle and bone.,  Transmetatarsal amputation through the neck of the metatarsals.   PLACE INSTALLATION VAC, with 10 cc of installation 10-minute dwell time 2 hours suction  SURGEON:  Nadara Mustard, MD  PHYSICIAN ASSISTANT:None ANESTHESIA:   General  PREOPERATIVE INDICATIONS:  Mardee Munnerlyn is a  53 y.o. female with a diagnosis of Crush Injury Left Foot who failed conservative measures and elected for surgical management.    The risks benefits and alternatives were discussed with the patient preoperatively including but not limited to the risks of infection, bleeding, nerve injury, cardiopulmonary complications, the need for revision surgery, among others, and the patient was willing to proceed.  OPERATIVE IMPLANTS: Cleanse choice dressing sponges x2  @ENCIMAGES @  OPERATIVE FINDINGS: Skin dorsally was necrotic.  Plantar skin is marginally viable.  All soft tissue around the great toe and little toe were nonviable.  OPERATIVE PROCEDURE: Patient was brought the operating room and underwent a general anesthetic.  After adequate levels anesthesia were obtained patient's left lower extremity was prepped using DuraPrep draped into a sterile field a timeout was called.  The pins of the great toe was removed.  A 21 blade knife was used to excise back to bleeding viable skin on the dorsum of the foot this is necessitated excising almost the entire tissue envelope of the dorsum of the foot.  The plantar flap was left intact this had marginal circulation.  Further debridement with a 21 blade knife was used to excise skin and soft tissue from beneath the plantar skin under the metatarsals as well as dorsally.  The soft tissue envelope around the little toe and great toe were nonviable  and the both the little toe and great toe were amputated.  A transmetatarsal amputation was performed through the metatarsal necks 2 Gy to bone back to healthy viable tissue.  The wound was irrigated with normal saline electrocautery was used for hemostasis.  A cleanse choice wound VAC reticulated foam and solid foam were then placed between the plantar flap and the metatarsals extended over the metatarsal shafts and dorsally over the foot the plantar flap was extended distally and this was all secured with Ioban.  This had a good suction fit this was set for 10 cc of installation.  This had a good seal.  Patient had the wrap then overwrapped with Coban patient was extubated taken the PACU in stable condition.   DISCHARGE PLANNING:  Antibiotic duration: Continue antibiotics through Friday.    Weightbearing: Nonweightbearing on the left  Pain medication: Opioid pathway  Dressing care/ Wound VAC: Continue wound VAC to not remove.  Ambulatory devices: Walker.  Discharge to: Return to the operating room on Friday.  Follow-up: In the office 1 week post operative.

## 2018-08-02 NOTE — Anesthesia Procedure Notes (Signed)
Procedure Name: LMA Insertion Date/Time: 08/02/2018 4:14 PM Performed by: Zollie Scale, CRNA Pre-anesthesia Checklist: Patient identified, Emergency Drugs available, Suction available and Patient being monitored Patient Re-evaluated:Patient Re-evaluated prior to induction Oxygen Delivery Method: Circle System Utilized Preoxygenation: Pre-oxygenation with 100% oxygen Induction Type: IV induction Ventilation: Mask ventilation without difficulty LMA: LMA inserted LMA Size: 4.0 Number of attempts: 1 Airway Equipment and Method: Bite block Placement Confirmation: positive ETCO2 Tube secured with: Tape Dental Injury: Teeth and Oropharynx as per pre-operative assessment

## 2018-08-02 NOTE — Transfer of Care (Signed)
Immediate Anesthesia Transfer of Care Note  Patient: Katelyn Chapman  Procedure(s) Performed: DEBRIDEMENT LEFT FOOT, PLACE VAC (Left Foot)  Patient Location: PACU  Anesthesia Type:General  Level of Consciousness: drowsy  Airway & Oxygen Therapy: Patient Spontanous Breathing and Patient connected to face mask oxygen  Post-op Assessment: Report given to RN and Post -op Vital signs reviewed and stable  Post vital signs: Reviewed and stable  Last Vitals:  Vitals Value Taken Time  BP 115/76 08/02/2018  4:47 PM  Temp    Pulse 86 08/02/2018  4:48 PM  Resp 10 08/02/2018  4:48 PM  SpO2 100 % 08/02/2018  4:48 PM  Vitals shown include unvalidated device data.  Last Pain:  Vitals:   08/02/18 1324  TempSrc:   PainSc: 5       Patients Stated Pain Goal: 3 (08/02/18 1324)  Complications: No apparent anesthesia complications

## 2018-08-02 NOTE — Anesthesia Preprocedure Evaluation (Signed)
Anesthesia Evaluation  Patient identified by MRN, date of birth, ID band Patient awake    Reviewed: Allergy & Precautions, H&P , NPO status , Patient's Chart, lab work & pertinent test results  Airway Mallampati: II   Neck ROM: full    Dental   Pulmonary neg pulmonary ROS,    breath sounds clear to auscultation       Cardiovascular negative cardio ROS   Rhythm:regular Rate:Normal     Neuro/Psych    GI/Hepatic   Endo/Other    Renal/GU      Musculoskeletal Crush injury left foot   Abdominal   Peds  Hematology   Anesthesia Other Findings   Reproductive/Obstetrics                             Anesthesia Physical Anesthesia Plan  ASA: I  Anesthesia Plan: General   Post-op Pain Management:    Induction: Intravenous  PONV Risk Score and Plan: 3 and Ondansetron, Dexamethasone, Midazolam and Treatment may vary due to age or medical condition  Airway Management Planned: LMA  Additional Equipment:   Intra-op Plan:   Post-operative Plan:   Informed Consent: I have reviewed the patients History and Physical, chart, labs and discussed the procedure including the risks, benefits and alternatives for the proposed anesthesia with the patient or authorized representative who has indicated his/her understanding and acceptance.       Plan Discussed with: CRNA, Anesthesiologist and Surgeon  Anesthesia Plan Comments:         Anesthesia Quick Evaluation

## 2018-08-02 NOTE — Plan of Care (Signed)
  Problem: Education: Goal: Knowledge of General Education information will improve Description: Including pain rating scale, medication(s)/side effects and non-pharmacologic comfort measures Outcome: Progressing   Problem: Pain Managment: Goal: General experience of comfort will improve Outcome: Progressing   Problem: Safety: Goal: Ability to remain free from injury will improve Outcome: Progressing   

## 2018-08-02 NOTE — H&P (Signed)
Katelyn Chapman is an 53 y.o. female.   Chief ComPlaint: painful crush injury left foot with gangrene.     HPI: Patient is a 53 year old woman who is seen for initial evaluation in referral from Dr. Dan Maker for crush injury to her left foot.  Patient's foot was run over by a forklift truck.  She underwent urgent surgical intervention with amputation of toes 2 through 4 pinning of the first ray.  At home patient was supposed to be followed up with home health nursing but this did not happen and family went to the emergency room on Sunday for wound evaluation due to worsening of the appearance of the dressing and increasing pain.  Past medical history negative for diabetes negative for hypertension negative for high cholesterol negative for tobacco use.  Past Medical History:  Diagnosis Date  . Medical history non-contributory     Past Surgical History:  Procedure Laterality Date  . AMPUTATION TOE Left 07/24/2018   Procedure: AMPUTATION TOE;  Surgeon: Roby Lofts, MD;  Location: [facility] OR;  Service: Orthopedics;  Laterality: Left;  . CHOLECYSTECTOMY    . I&D EXTREMITY Left 07/24/2018   Procedure: IRRIGATION AND DEBRIDEMENT EXTREMITY;  Surgeon: Roby Lofts, MD;  Location: [facility] OR;  Service: Orthopedics;  Laterality: Left;  . ORIF TOE FRACTURE Left 07/24/2018   Procedure: OPEN REDUCTION INTERNAL FIXATION (ORIF) METATARSAL (TOE) FRACTURE;  Surgeon: Roby Lofts, MD;  Location: [facility] OR;  Service: Orthopedics;  Laterality: Left;    No family history on file. Social History:  reports that she has never smoked. She has never used smokeless tobacco. She reports that she does not drink alcohol or use drugs.  Allergies: No Known Allergies  No medications prior to admission.    No results found for this or any previous visit (from the past 48 hour(s)). No results found.  Review of Systems  All other systems reviewed and are negative.   Last menstrual period 07/28/2018. Physical Exam    Patient is alert, oriented, no adenopathy, well-dressed, normal affect, normal respiratory effort. Examination patient has a good dorsalis pedis and posterior tibial pulse she does have swelling to the foot and has pain to light palpation.  The forefoot is black and gangrenous.  The great toe and little toe are viable the ischemic changes involve both the plantar and dorsal aspect of the forefoot.  Review of the radiographs shows a fracture through the base of the first metatarsal consistent with a unstable Lisfranc complex.      Assessment/Plan 1. Crushing injury of left foot, sequela   2. Lisfranc dislocation, left, sequela     Plan: Discussed with the patient her family through the interpreter with case management present that she has a critical injury to the left forefoot.  Discussed that we will proceed to the operating room tomorrow for debridement of the necrotic tissue application of an installation wound VAC with return to the operating room on Friday at which time anticipate that we would also amputate the great toe for tissue transfer to cover the area of necrotic tissue and would do the same with amputation of the little toe with application of split-thickness skin graft and a incisional wound VAC.  Also discussed that with the instability of the Lisfranc complex would plan for percutaneous screw fixation for the Lisfranc complex.  Risks and benefits were discussed all questions were encouraged and answered.  Anticipate patient will be in the hospital 4 to 5 days.    Berna Spare  Kandis Mannan, MD 08/02/2018, 7:06 AM

## 2018-08-03 ENCOUNTER — Encounter (HOSPITAL_COMMUNITY): Payer: Self-pay | Admitting: Orthopedic Surgery

## 2018-08-03 ENCOUNTER — Ambulatory Visit (INDEPENDENT_AMBULATORY_CARE_PROVIDER_SITE_OTHER): Payer: Self-pay | Admitting: Physician Assistant

## 2018-08-03 ENCOUNTER — Telehealth (INDEPENDENT_AMBULATORY_CARE_PROVIDER_SITE_OTHER): Payer: Self-pay | Admitting: Orthopedic Surgery

## 2018-08-03 MED ORDER — CHLORHEXIDINE GLUCONATE 4 % EX LIQD: 60.0000 mL | Freq: Once | CUTANEOUS | Status: DC

## 2018-08-03 MED ORDER — CHLORHEXIDINE GLUCONATE 4 % EX LIQD
60.0000 mL | Freq: Once | CUTANEOUS | Status: AC
  Administered 2018-08-04: 4 via TOPICAL
  Filled 2018-08-03: qty 60

## 2018-08-03 NOTE — Telephone Encounter (Signed)
Thank you :)

## 2018-08-03 NOTE — Telephone Encounter (Signed)
New Message  Pts post op has already been scheduled.

## 2018-08-03 NOTE — Anesthesia Postprocedure Evaluation (Signed)
Anesthesia Post Note  Patient: Katelyn Chapman  Procedure(s) Performed: DEBRIDEMENT LEFT FOOT, PLACE VAC (Left Foot)     Patient location during evaluation: PACU Anesthesia Type: General Level of consciousness: awake and alert Pain management: pain level controlled Vital Signs Assessment: post-procedure vital signs reviewed and stable Respiratory status: spontaneous breathing, nonlabored ventilation, respiratory function stable and patient connected to nasal cannula oxygen Cardiovascular status: blood pressure returned to baseline and stable Postop Assessment: no apparent nausea or vomiting Anesthetic complications: no    Last Vitals:  Vitals:   08/03/18 0012 08/03/18 0542  BP: 120/66 116/70  Pulse: 82 78  Resp: 16 15  Temp: 36.7 C 36.9 C  SpO2: 95% 95%    Last Pain:  Vitals:   08/03/18 0544  TempSrc:   PainSc: 4                  Hallel Denherder S

## 2018-08-03 NOTE — Progress Notes (Signed)
Patient ID: Katelyn Chapman, female   DOB: 01/15/1966, 53 y.o.   MRN: [id number] Postoperative day 1 status post revision of the transmetatarsal amputation patient had extensive soft tissue necrosis.  The installation wound VAC is functioning well.  Plan for reevaluation tomorrow for continued foot salvage intervention possible split-thickness skin graft.

## 2018-08-03 NOTE — H&P (View-Only) (Signed)
Patient ID: Katelyn Chapman, female   DOB: 05/09/1966, 52 y.o.   MRN: [id number] Postoperative day 1 status post revision of the transmetatarsal amputation patient had extensive soft tissue necrosis.  The installation wound VAC is functioning well.  Plan for reevaluation tomorrow for continued foot salvage intervention possible split-thickness skin graft. 

## 2018-08-04 ENCOUNTER — Inpatient Hospital Stay (HOSPITAL_COMMUNITY): Payer: Worker's Compensation | Admitting: Anesthesiology

## 2018-08-04 ENCOUNTER — Encounter (HOSPITAL_COMMUNITY): Payer: Self-pay | Admitting: *Deleted

## 2018-08-04 ENCOUNTER — Encounter (HOSPITAL_COMMUNITY)
Admission: RE | Disposition: A | Discharge status: Home / Self Care | Payer: Self-pay | Source: Ambulatory Visit | Attending: Orthopedic Surgery

## 2018-08-04 DIAGNOSIS — S93325S Dislocation of tarsometatarsal joint of left foot, sequela: Secondary | ICD-10-CM

## 2018-08-04 HISTORY — PX: ORIF ANKLE FRACTURE: SHX5408

## 2018-08-04 HISTORY — PX: SKIN SPLIT GRAFT: SHX444

## 2018-08-04 LAB — SURGICAL PCR SCREEN
MRSA, PCR: NEGATIVE
STAPHYLOCOCCUS AUREUS: NEGATIVE

## 2018-08-04 SURGERY — OPEN REDUCTION INTERNAL FIXATION (ORIF) ANKLE FRACTURE
Anesthesia: General | Site: Foot | Laterality: Left

## 2018-08-04 MED ORDER — METOCLOPRAMIDE HCL 5 MG PO TABS: 5.0000 mg | ORAL_TABLET | Freq: Three times a day (TID) | ORAL | Status: DC | PRN

## 2018-08-04 MED ORDER — PROPOFOL 10 MG/ML IV BOLUS
INTRAVENOUS | Status: AC
  Filled 2018-08-04: qty 20

## 2018-08-04 MED ORDER — MIDAZOLAM HCL 2 MG/2ML IJ SOLN
INTRAMUSCULAR | Status: DC | PRN
  Administered 2018-08-04: 2 mg via INTRAVENOUS

## 2018-08-04 MED ORDER — FENTANYL CITRATE (PF) 100 MCG/2ML IJ SOLN
100.0000 ug | Freq: Once | INTRAMUSCULAR | Status: AC
  Administered 2018-08-04: 100 ug via INTRAVENOUS

## 2018-08-04 MED ORDER — PHENYLEPHRINE 40 MCG/ML (10ML) SYRINGE FOR IV PUSH (FOR BLOOD PRESSURE SUPPORT)
PREFILLED_SYRINGE | INTRAVENOUS | Status: AC
  Filled 2018-08-04: qty 10

## 2018-08-04 MED ORDER — MUPIROCIN CALCIUM 2 % EX CREA
TOPICAL_CREAM | CUTANEOUS | Status: AC
  Filled 2018-08-04: qty 15

## 2018-08-04 MED ORDER — BUPIVACAINE HCL (PF) 0.5 % IJ SOLN
INTRAMUSCULAR | Status: DC | PRN
  Administered 2018-08-04: 10 mL via PERINEURAL

## 2018-08-04 MED ORDER — ONDANSETRON HCL 4 MG/2ML IJ SOLN
INTRAMUSCULAR | Status: AC
  Filled 2018-08-04: qty 2

## 2018-08-04 MED ORDER — SODIUM CHLORIDE 0.9 % IV SOLN
INTRAVENOUS | Status: DC | PRN
  Administered 2018-08-04: 25 ug/min via INTRAVENOUS

## 2018-08-04 MED ORDER — BISACODYL 10 MG RE SUPP: 10.0000 mg | Freq: Every day | RECTAL | Status: DC | PRN

## 2018-08-04 MED ORDER — ACETAMINOPHEN 500 MG PO TABS
1000.0000 mg | ORAL_TABLET | Freq: Once | ORAL | Status: AC
  Administered 2018-08-04: 1000 mg via ORAL
  Filled 2018-08-04: qty 2

## 2018-08-04 MED ORDER — METOCLOPRAMIDE HCL 5 MG/ML IJ SOLN: 5.0000 mg | Freq: Three times a day (TID) | INTRAMUSCULAR | Status: DC | PRN

## 2018-08-04 MED ORDER — METHOCARBAMOL 500 MG PO TABS
500.0000 mg | ORAL_TABLET | Freq: Four times a day (QID) | ORAL | Status: DC | PRN
  Administered 2018-08-05 – 2018-08-06 (×4): 500 mg via ORAL
  Filled 2018-08-04 (×4): qty 1

## 2018-08-04 MED ORDER — DEXAMETHASONE SODIUM PHOSPHATE 10 MG/ML IJ SOLN
INTRAMUSCULAR | Status: AC
  Filled 2018-08-04: qty 1

## 2018-08-04 MED ORDER — ONDANSETRON HCL 4 MG PO TABS: 4.0000 mg | ORAL_TABLET | Freq: Four times a day (QID) | ORAL | Status: DC | PRN

## 2018-08-04 MED ORDER — MAGNESIUM CITRATE PO SOLN
1.0000 | Freq: Once | ORAL | Status: DC | PRN
  Filled 2018-08-04: qty 296

## 2018-08-04 MED ORDER — PROPOFOL 10 MG/ML IV BOLUS
INTRAVENOUS | Status: DC | PRN
  Administered 2018-08-04: 100 mg via INTRAVENOUS

## 2018-08-04 MED ORDER — METHOCARBAMOL 1000 MG/10ML IJ SOLN
500.0000 mg | Freq: Four times a day (QID) | INTRAVENOUS | Status: DC | PRN
  Filled 2018-08-04: qty 5

## 2018-08-04 MED ORDER — MINERAL OIL LIGHT OIL: TOPICAL_OIL | Status: DC

## 2018-08-04 MED ORDER — CEFAZOLIN SODIUM-DEXTROSE 2-4 GM/100ML-% IV SOLN
2.0000 g | INTRAVENOUS | Status: DC
  Filled 2018-08-04: qty 100

## 2018-08-04 MED ORDER — LACTATED RINGERS IV SOLN
INTRAVENOUS | Status: DC
  Administered 2018-08-04: 08:00:00 via INTRAVENOUS

## 2018-08-04 MED ORDER — DEXAMETHASONE SODIUM PHOSPHATE 10 MG/ML IJ SOLN
INTRAMUSCULAR | Status: DC | PRN
  Administered 2018-08-04: 5 mg via INTRAVENOUS

## 2018-08-04 MED ORDER — MIDAZOLAM HCL 2 MG/2ML IJ SOLN
2.0000 mg | Freq: Once | INTRAMUSCULAR | Status: AC
  Administered 2018-08-04: 2 mg via INTRAVENOUS

## 2018-08-04 MED ORDER — EPHEDRINE 5 MG/ML INJ
INTRAVENOUS | Status: AC
  Filled 2018-08-04: qty 10

## 2018-08-04 MED ORDER — MIDAZOLAM HCL 2 MG/2ML IJ SOLN
INTRAMUSCULAR | Status: AC
  Filled 2018-08-04: qty 2

## 2018-08-04 MED ORDER — POLYETHYLENE GLYCOL 3350 17 G PO PACK
17.0000 g | PACK | Freq: Every day | ORAL | Status: DC | PRN
  Administered 2018-08-05 – 2018-08-06 (×2): 17 g via ORAL
  Filled 2018-08-04 (×2): qty 1

## 2018-08-04 MED ORDER — BUPIVACAINE-EPINEPHRINE (PF) 0.5% -1:200000 IJ SOLN
INTRAMUSCULAR | Status: DC | PRN
  Administered 2018-08-04: 25 mL via PERINEURAL

## 2018-08-04 MED ORDER — ONDANSETRON HCL 4 MG/2ML IJ SOLN: 4.0000 mg | Freq: Four times a day (QID) | INTRAMUSCULAR | Status: DC | PRN

## 2018-08-04 MED ORDER — ONDANSETRON HCL 4 MG/2ML IJ SOLN
INTRAMUSCULAR | Status: DC | PRN
  Administered 2018-08-04: 4 mg via INTRAVENOUS

## 2018-08-04 MED ORDER — FENTANYL CITRATE (PF) 100 MCG/2ML IJ SOLN
INTRAMUSCULAR | Status: AC
  Filled 2018-08-04: qty 2

## 2018-08-04 MED ORDER — FENTANYL CITRATE (PF) 250 MCG/5ML IJ SOLN
INTRAMUSCULAR | Status: DC | PRN
  Administered 2018-08-04: 50 ug via INTRAVENOUS

## 2018-08-04 MED ORDER — EPHEDRINE SULFATE 50 MG/ML IJ SOLN
INTRAMUSCULAR | Status: DC | PRN
  Administered 2018-08-04: 10 mg via INTRAVENOUS

## 2018-08-04 MED ORDER — FENTANYL CITRATE (PF) 250 MCG/5ML IJ SOLN
INTRAMUSCULAR | Status: AC
  Filled 2018-08-04: qty 5

## 2018-08-04 MED ORDER — 0.9 % SODIUM CHLORIDE (POUR BTL) OPTIME
TOPICAL | Status: DC | PRN
  Administered 2018-08-04: 1000 mL

## 2018-08-04 MED ORDER — HYDROMORPHONE HCL 1 MG/ML IJ SOLN: 0.2500 mg | INTRAMUSCULAR | Status: DC | PRN

## 2018-08-04 MED ORDER — LIDOCAINE HCL (CARDIAC) PF 100 MG/5ML IV SOSY
PREFILLED_SYRINGE | INTRAVENOUS | Status: DC | PRN
  Administered 2018-08-04: 40 mg via INTRATRACHEAL

## 2018-08-04 MED ORDER — MINERAL OIL LIGHT 100 % EX OIL
TOPICAL_OIL | Freq: Once | CUTANEOUS | Status: DC
  Filled 2018-08-04: qty 25

## 2018-08-04 MED ORDER — SODIUM CHLORIDE 0.9 % IV SOLN
INTRAVENOUS | Status: DC
  Administered 2018-08-04: 16:00:00 via INTRAVENOUS

## 2018-08-04 MED ORDER — PHENYLEPHRINE HCL 10 MG/ML IJ SOLN
INTRAMUSCULAR | Status: DC | PRN
  Administered 2018-08-04 (×2): 80 ug via INTRAVENOUS
  Administered 2018-08-04: 160 ug via INTRAVENOUS
  Administered 2018-08-04: 80 ug via INTRAVENOUS

## 2018-08-04 MED ORDER — DOCUSATE SODIUM 100 MG PO CAPS
100.0000 mg | ORAL_CAPSULE | Freq: Two times a day (BID) | ORAL | Status: DC
  Administered 2018-08-04 – 2018-08-07 (×6): 100 mg via ORAL
  Filled 2018-08-04 (×6): qty 1

## 2018-08-04 SURGICAL SUPPLY — 60 items
ALLOGRAFT SKIN MESHD 125 SQ CM (Tissue) ×1 IMPLANT
BANDAGE ESMARK 6X9 LF (GAUZE/BANDAGES/DRESSINGS) IMPLANT
BNDG COHESIVE 4X5 TAN STRL (GAUZE/BANDAGES/DRESSINGS) ×3 IMPLANT
BNDG COHESIVE 6X5 TAN STRL LF (GAUZE/BANDAGES/DRESSINGS) IMPLANT
BNDG ESMARK 4X9 LF (GAUZE/BANDAGES/DRESSINGS) ×3 IMPLANT
BNDG ESMARK 6X9 LF (GAUZE/BANDAGES/DRESSINGS)
BNDG GAUZE ELAST 4 BULKY (GAUZE/BANDAGES/DRESSINGS) ×3 IMPLANT
BNDG GAUZE STRTCH 6 (GAUZE/BANDAGES/DRESSINGS) IMPLANT
CANISTER WOUNDNEG PRESSURE 500 (CANNISTER) ×2 IMPLANT
COVER SURGICAL LIGHT HANDLE (MISCELLANEOUS) ×6 IMPLANT
COVER WAND RF STERILE (DRAPES) ×3 IMPLANT
CUFF TOURNIQUET SINGLE 18IN (TOURNIQUET CUFF) IMPLANT
CUFF TOURNIQUET SINGLE 24IN (TOURNIQUET CUFF) IMPLANT
DERMACARRIERS GRAFT 1 TO 1.5 (DISPOSABLE)
DRAPE OEC MINIVIEW 54X84 (DRAPES) IMPLANT
DRAPE U-SHAPE 47X51 STRL (DRAPES) ×3 IMPLANT
DRESSING VERAFLO CLEANSE CC (GAUZE/BANDAGES/DRESSINGS) IMPLANT
DRSG ADAPTIC 3X8 NADH LF (GAUZE/BANDAGES/DRESSINGS) ×3 IMPLANT
DRSG MEPITEL 4X7.2 (GAUZE/BANDAGES/DRESSINGS) ×3 IMPLANT
DRSG PAD ABDOMINAL 8X10 ST (GAUZE/BANDAGES/DRESSINGS) ×3 IMPLANT
DRSG VERAFLO CLEANSE CC (GAUZE/BANDAGES/DRESSINGS) ×3
DURAPREP 26ML APPLICATOR (WOUND CARE) ×3 IMPLANT
ELECT REM PT RETURN 9FT ADLT (ELECTROSURGICAL) ×3
ELECTRODE REM PT RTRN 9FT ADLT (ELECTROSURGICAL) ×1 IMPLANT
GAUZE SPONGE 4X4 12PLY STRL (GAUZE/BANDAGES/DRESSINGS) ×3 IMPLANT
GLOVE BIOGEL PI IND STRL 9 (GLOVE) ×1 IMPLANT
GLOVE BIOGEL PI INDICATOR 9 (GLOVE) ×2
GLOVE SURG ORTHO 9.0 STRL STRW (GLOVE) ×3 IMPLANT
GOWN STRL REUS W/ TWL XL LVL3 (GOWN DISPOSABLE) ×3 IMPLANT
GOWN STRL REUS W/TWL XL LVL3 (GOWN DISPOSABLE) ×6
GRAFT DERMACARRIERS 1 TO 1.5 (DISPOSABLE) IMPLANT
GRAFT TISS MESH 125 BURN (Tissue) IMPLANT
GUIDEWIRE NON THREAD 1.6MM (WIRE) ×4 IMPLANT
KIT BASIN OR (CUSTOM PROCEDURE TRAY) ×3 IMPLANT
KIT TURNOVER KIT B (KITS) ×3 IMPLANT
MANIFOLD NEPTUNE II (INSTRUMENTS) ×3 IMPLANT
NDL HYPO 25GX1X1/2 BEV (NEEDLE) IMPLANT
NEEDLE HYPO 25GX1X1/2 BEV (NEEDLE) IMPLANT
NS IRRIG 1000ML POUR BTL (IV SOLUTION) ×3 IMPLANT
PACK ORTHO EXTREMITY (CUSTOM PROCEDURE TRAY) ×3 IMPLANT
PAD ARMBOARD 7.5X6 YLW CONV (MISCELLANEOUS) ×6 IMPLANT
PAD CAST 4YDX4 CTTN HI CHSV (CAST SUPPLIES) IMPLANT
PAD NEG PRESSURE SENSATRAC (MISCELLANEOUS) ×2 IMPLANT
PADDING CAST COTTON 4X4 STRL (CAST SUPPLIES)
SCREW COMP HEADLESS 4.5X36MM (Screw) ×2 IMPLANT
SCREW COMPR HDLS ST 4.5X40 (Screw) ×2 IMPLANT
SKIN MESHED 125 SQ CM (Tissue) ×3 IMPLANT
STAPLER VISISTAT 35W (STAPLE) IMPLANT
SUCTION FRAZIER HANDLE 10FR (MISCELLANEOUS) ×2
SUCTION TUBE FRAZIER 10FR DISP (MISCELLANEOUS) ×1 IMPLANT
SUT ETHILON 2 0 PSLX (SUTURE) ×2 IMPLANT
SUT ETHILON 4 0 PS 2 18 (SUTURE) IMPLANT
SUT VIC AB 2-0 CT1 27 (SUTURE) ×2
SUT VIC AB 2-0 CT1 TAPERPNT 27 (SUTURE) ×1 IMPLANT
SYR CONTROL 10ML LL (SYRINGE) IMPLANT
TOWEL OR 17X24 6PK STRL BLUE (TOWEL DISPOSABLE) ×3 IMPLANT
TOWEL OR 17X26 10 PK STRL BLUE (TOWEL DISPOSABLE) ×3 IMPLANT
TUBE CONNECTING 12'X1/4 (SUCTIONS) ×1
TUBE CONNECTING 12X1/4 (SUCTIONS) ×2 IMPLANT
WATER STERILE IRR 1000ML POUR (IV SOLUTION) ×3 IMPLANT

## 2018-08-04 NOTE — Op Note (Signed)
08/04/2018  11:15 AM  PATIENT:  Katelyn Chapman    PRE-OPERATIVE DIAGNOSIS:  Crush Injury Left Foot  POST-OPERATIVE DIAGNOSIS:  Same  PROCEDURE:   OPEN REDUCTION INTERNAL FIXATION (ORIF) LISFRANC FRACTURE DISLOCATION LEFT FOOT,  SKIN GRAFT SPLIT THICKNESS LEFT FOOT 125 cm. Wound bed preparation for skin graft application 125 cm. Local tissue rearrangement for wound closure 10 x 3 cm. Application of cleanse choice dressing wound VAC. C-arm fluoroscopy to verify reduction of the Lisfranc fracture dislocation.  SURGEON:  Nadara Mustard, MD  PHYSICIAN ASSISTANT:None ANESTHESIA:   General  PREOPERATIVE INDICATIONS:  [name] is a  53 y.o. female with a diagnosis of Crush Injury Left Foot who failed conservative measures and elected for surgical management.    The risks benefits and alternatives were discussed with the patient preoperatively including but not limited to the risks of infection, bleeding, nerve injury, cardiopulmonary complications, the need for revision surgery, among others, and the patient was willing to proceed.  OPERATIVE IMPLANTS: 125 cm split-thickness skin graft allograft with cleanse choice dressing.  @ENCIMAGES @  OPERATIVE FINDINGS: Progressive increasing necrotic tissue to the posterior flap.  OPERATIVE PROCEDURE: Patient was brought to the operating room and underwent a general anesthetic.  After adequate levels anesthesia were obtained patient's left lower extremity was prepped using Betadine paint and draped into a sterile field a timeout was called.  Patient underwent wound bed preparation for the split-thickness skin graft with debridement of skin and soft tissue muscle and fascia.  The transmetatarsal was revised approximately 1 cm with resection of all of the metatarsals.  Patient's Lisfranc complex was unstable.  Under direct visualization a guidewire was inserted from the base of the first metatarsal into the medial cuneiform and a 40  mm 4.5 partially-threaded screw was used to stabilize the base of the first metatarsal.  A separate guidewire was then placed from the medial cuneiform into the base of the second metatarsal to stabilize the Lisfranc complex a 36 mm screw was placed 4.5 mm in diameter.  C-arm fluoroscopy verified reduction.  The posterior flap was revised with approximately a half a centimeter of the distal flap necrotic.  The wounds were all irrigated with normal saline a rotation flap was then provided with the posterior flap with local tissue rearrangement and this was secured with 2-0 nylon approximately 10 x 3 cm.  Split-thickness skin graft was then placed dorsally and this was secured with staples.  A cleanse choice wound VAC was applied this was covered with Ioban and a Covan dressing this had a good suction fit patient was extubated taken the PACU in stable condition.   DISCHARGE PLANNING:  Antibiotic duration: Continue antibiotics for 24 hours  Weightbearing: Nonweightbearing on the left  Pain medication: Opioid pathway ordered  Dressing care/ Wound VAC: Wound VAC do not remove for 1 week  Ambulatory devices: Walker  Discharge to: Anticipate discharge to home.  Follow-up: In the office 1 week post operative.

## 2018-08-04 NOTE — Progress Notes (Signed)
PT Cancellation Note  Patient Details Name: Katelyn Chapman MRN: [id number] DOB: [birth year]   Cancelled Treatment:    Reason Eval/Treat Not Completed: Patient not medically ready- After speaking with RN, attempted to see patient, but patient refused PT this afternoon. Wants to wait until tomorrow morning.     Lyric Rossano 08/04/2018, 2:56 PM

## 2018-08-04 NOTE — Anesthesia Preprocedure Evaluation (Addendum)
Anesthesia Evaluation  Patient identified by MRN, date of birth, ID band Patient awake    Reviewed: Allergy & Precautions, H&P , NPO status , Patient's Chart, lab work & pertinent test results  Airway Mallampati: III  TM Distance: >3 FB Neck ROM: Full    Dental no notable dental hx. (+) Partial Lower, Partial Upper, Dental Advisory Given   Pulmonary neg pulmonary ROS,    Pulmonary exam normal breath sounds clear to auscultation       Cardiovascular negative cardio ROS   Rhythm:Regular Rate:Normal     Neuro/Psych negative neurological ROS  negative psych ROS   GI/Hepatic negative GI ROS, Neg liver ROS,   Endo/Other  negative endocrine ROS  Renal/GU negative Renal ROS  negative genitourinary   Musculoskeletal   Abdominal   Peds  Hematology negative hematology ROS (+)   Anesthesia Other Findings   Reproductive/Obstetrics negative OB ROS                            Anesthesia Physical Anesthesia Plan  ASA: I  Anesthesia Plan: General   Post-op Pain Management:  Regional for Post-op pain   Induction: Intravenous  PONV Risk Score and Plan: 3 and Ondansetron, Dexamethasone and Midazolam  Airway Management Planned: LMA  Additional Equipment:   Intra-op Plan:   Post-operative Plan: Extubation in OR  Informed Consent: I have reviewed the patients History and Physical, chart, labs and discussed the procedure including the risks, benefits and alternatives for the proposed anesthesia with the patient or authorized representative who has indicated his/her understanding and acceptance.     Dental advisory given  Plan Discussed with: CRNA  Anesthesia Plan Comments:         Anesthesia Quick Evaluation

## 2018-08-04 NOTE — Anesthesia Postprocedure Evaluation (Signed)
Anesthesia Post Note  Patient: Katelyn Chapman  Procedure(s) Performed: OPEN REDUCTION INTERNAL FIXATION (ORIF) LISFRANC FRACTURE DISLOCATION LEFT FOOT (Left Foot) SKIN GRAFT SPLIT THICKNESS LEFT FOOT (Left Foot)     Patient location during evaluation: PACU Anesthesia Type: General and Regional Level of consciousness: awake and alert Pain management: pain level controlled Vital Signs Assessment: post-procedure vital signs reviewed and stable Respiratory status: spontaneous breathing, nonlabored ventilation and respiratory function stable Cardiovascular status: blood pressure returned to baseline and stable Postop Assessment: no apparent nausea or vomiting Anesthetic complications: no    Last Vitals:  Vitals:   08/04/18 1142 08/04/18 1157  BP: 106/67   Pulse: 69   Resp: 14   Temp:  36.4 C  SpO2: 98%     Last Pain:  Vitals:   08/04/18 1142  TempSrc:   PainSc: 0-No pain                 Clarity [name],[name]

## 2018-08-04 NOTE — Anesthesia Procedure Notes (Signed)
Procedure Name: LMA Insertion Date/Time: 08/04/2018 10:30 AM Performed by: Modena Morrow, CRNA Pre-anesthesia Checklist: Patient identified, Emergency Drugs available, Suction available, Patient being monitored and Timeout performed Patient Re-evaluated:Patient Re-evaluated prior to induction Oxygen Delivery Method: Circle system utilized Preoxygenation: Pre-oxygenation with 100% oxygen Induction Type: IV induction Ventilation: Mask ventilation without difficulty LMA: LMA inserted LMA Size: 4.0 Number of attempts: 1 Airway Equipment and Method: Patient positioned with wedge pillow and Oral airway Placement Confirmation: positive ETCO2 and breath sounds checked- equal and bilateral Tube secured with: Tape Dental Injury: Teeth and Oropharynx as per pre-operative assessment

## 2018-08-04 NOTE — Anesthesia Procedure Notes (Signed)
Anesthesia Regional Block: Popliteal block   Pre-Anesthetic Checklist: ,, timeout performed, Correct Patient, Correct Site, Correct Laterality, Correct Procedure, Correct Position, site marked, Risks and benefits discussed, pre-op evaluation,  At surgeon's request and post-op pain management  Laterality: Left  Prep: Maximum Sterile Barrier Precautions used, chloraprep       Needles:  Injection technique: Single-shot  Needle Type: Echogenic Stimulator Needle     Needle Length: 9cm  Needle Gauge: 21     Additional Needles:   Procedures:,,,, ultrasound used (permanent image in chart),,,,  Narrative:  Start time: 08/04/2018 9:00 AM End time: 08/04/2018 9:05 AM Injection made incrementally with aspirations every 5 mL. Anesthesiologist: Gaynelle Adu, MD  Additional Notes: 2% Lidocaine skin wheel.

## 2018-08-04 NOTE — Anesthesia Procedure Notes (Signed)
Anesthesia Regional Block: Adductor canal block   Pre-Anesthetic Checklist: ,, timeout performed, Correct Patient, Correct Site, Correct Laterality, Correct Procedure, Correct Position, site marked, Risks and benefits discussed, pre-op evaluation,  At surgeon's request and post-op pain management  Laterality: Left  Prep: Maximum Sterile Barrier Precautions used, chloraprep       Needles:  Injection technique: Single-shot  Needle Type: Echogenic Stimulator Needle     Needle Length: 9cm  Needle Gauge: 21     Additional Needles:   Procedures:,,,, ultrasound used (permanent image in chart),,,,  Narrative:  Start time: 08/04/2018 8:55 AM End time: 08/04/2018 9:00 AM Injection made incrementally with aspirations every 5 mL.  Performed by: Personally  Anesthesiologist: Gaynelle Adu, MD  Additional Notes: 2% Lidocaine skin wheel.

## 2018-08-04 NOTE — Progress Notes (Signed)
Orthopedic Tech Progress Note Patient Details:  Katelyn Chapman 09-Nov-1965 [id number]  Ortho Devices Type of Ortho Device: Postop shoe/boot Ortho Device/Splint Location: LRE Ortho Device/Splint Interventions: Adjustment, Application, Ordered   Post Interventions Patient Tolerated: Well Instructions Provided: Care of device, Adjustment of device   Donald Pore 08/04/2018, 1:46 PM

## 2018-08-04 NOTE — Interval H&P Note (Signed)
History and Physical Interval Note:  08/04/2018 6:59 AM  Katelyn Chapman  has presented today for surgery, with the diagnosis of Crush Injury Left Foot.  The various methods of treatment have been discussed with the patient and family. After consideration of risks, benefits and other options for treatment, the patient has consented to  Procedure(s): OPEN REDUCTION INTERNAL FIXATION (ORIF) LISFRANC FRACTURE DISLOCATION LEFT FOOT (Left) SKIN GRAFT SPLIT THICKNESS LEFT FOOT (Left) as a surgical intervention.  The patient's history has been reviewed, patient examined, no change in status, stable for surgery.  I have reviewed the patient's chart and labs.  Questions were answered to the patient's satisfaction.     Nadara Mustard

## 2018-08-04 NOTE — Transfer of Care (Signed)
Immediate Anesthesia Transfer of Care Note  Patient: Katelyn Chapman  Procedure(s) Performed: OPEN REDUCTION INTERNAL FIXATION (ORIF) LISFRANC FRACTURE DISLOCATION LEFT FOOT (Left Foot) SKIN GRAFT SPLIT THICKNESS LEFT FOOT (Left Foot)  Patient Location: PACU  Anesthesia Type:General  Level of Consciousness: drowsy and patient cooperative  Airway & Oxygen Therapy: Patient Spontanous Breathing and Patient connected to face mask oxygen  Post-op Assessment: Report given to RN and Post -op Vital signs reviewed and stable  Post vital signs: Reviewed and stable  Last Vitals:  Vitals Value Taken Time  BP    Temp    Pulse 73 08/04/2018 11:12 AM  Resp 10 08/04/2018 11:12 AM  SpO2 100 % 08/04/2018 11:12 AM  Vitals shown include unvalidated device data.  Last Pain:  Vitals:   08/04/18 0458  TempSrc: Oral  PainSc:       Patients Stated Pain Goal: 0 (08/03/18 2241)  Complications: No apparent anesthesia complications

## 2018-08-05 NOTE — Evaluation (Signed)
Physical Therapy Evaluation Patient Details Name: Katelyn Chapman MRN: [id number] DOB: March 21, 1966 Today's Date: 08/05/2018   History of Present Illness  Pt is a 53yo female who sustained a crush injury to her L foot. On 3/2 she underwent ampuation of toes 2-4 and pinning of first ray. D/c'd home then came to ED for evaluation of wound, pt now s/p L transmet amputation. PMH: unremarkable.  Clinical Impression  Pt admitted with above. Spouse reports he can take off as long as his wife needs help. Pt requesting to trial knee scooter as hopping on R LE with RW is to hard for her despite amb 21' today with RW without difficulty. Will trial next session. Used Spanish [employer] [phone]. Acute PT to cont to follow.    Follow Up Recommendations Home health PT;Supervision/Assistance - 24 hour    Equipment Recommendations  (requested to trial knee scooter, will assess next session)    Recommendations for Other Services       Precautions / Restrictions Precautions Precautions: Fall Precaution Comments: L foot wound vac, spanish speaking Required Braces or Orthoses: Other Brace Other Brace: L post op shoe Restrictions Weight Bearing Restrictions: Yes LLE Weight Bearing: Non weight bearing      Mobility  Bed Mobility Overal bed mobility: Modified Independent             General bed mobility comments: HOB elevated, pt able to move L LE without assist, increased time  Transfers Overall transfer level: Needs assistance Equipment used: Rolling walker (2 wheeled) Transfers: Sit to/from [employer] Sit to Stand: Min assist Stand pivot transfers: Min assist       General transfer comment: spouse reached for patient's hand and pulled her up into standing, pt able to maintain L LE NWB and complete std pvt transfer without L LE WBing  Ambulation/Gait Ambulation/Gait assistance: Min assist(for Building services engineer) Gait Distance (Feet): 30 Feet Assistive  device: Rolling walker (2 wheeled) Gait Pattern/deviations: Step-to pattern Gait velocity: dec   General Gait Details: pt ambulated to the door and back, no episodes of LOB, pt with good technique  Stairs            Wheelchair Mobility    Modified Rankin (Stroke Patients Only)       Balance Overall balance assessment: Needs assistance Sitting-balance support: No upper extremity supported;Feet supported Sitting balance-Leahy Scale: Normal     Standing balance support: During functional activity;Bilateral upper extremity supported Standing balance-Leahy Scale: Poor Standing balance comment: Pt able to maintain standing balance on one foot without UE support. Requires bil UE support for ambulation                             Pertinent Vitals/Pain Pain Assessment: 0-10 Pain Score: 6  Pain Location: L foot Pain Descriptors / Indicators: Throbbing Pain Intervention(s): Monitored during session    Home Living Family/patient expects to be discharged to:: Private residence Living Arrangements: Spouse/significant other Available Help at Discharge: Family;Available 24 hours/day(spouse works but he said he was taking off to help her) Type of Home: House Home Access: Stairs to enter Entrance Stairs-Rails: None Entrance Stairs-Number of Steps: 1 Home Layout: One level Home Equipment: Walker - 2 wheels      Prior Function Level of Independence: Independent         Comments: pt was injured at work, went home briefly in which she needed RW to amb and some assist for ADLs  Hand Dominance   Dominant Hand: Right    Extremity/Trunk Assessment   Upper Extremity Assessment Upper Extremity Assessment: Overall WFL for tasks assessed    Lower Extremity Assessment Lower Extremity Assessment: LLE deficits/detail LLE Deficits / Details: L foot in dressing, minimal ankle AROM, hip and Knee wfl    Cervical / Trunk Assessment Cervical / Trunk Assessment:  Normal  Communication   Communication: No difficulties  Cognition Arousal/Alertness: Awake/alert Behavior During Therapy: WFL for tasks assessed/performed Overall Cognitive Status: Within Functional Limits for tasks assessed                                 General Comments: used Spanish Interpretor Darden Dates [phone]      General Comments General comments (skin integrity, edema, etc.): VSS, wound vac intact    Exercises General Exercises - Lower Extremity Ankle Circles/Pumps: AROM;Left;5 reps;Supine Long Arc Quad: AROM;Left;5 reps;Seated   Assessment/Plan    PT Assessment Patient needs continued PT services  PT Problem List Pain;Decreased mobility;Decreased coordination;Decreased knowledge of use of DME       PT Treatment Interventions DME instruction;Functional mobility training;Balance training;Patient/family education;Gait training;Therapeutic activities;Neuromuscular re-education;Stair training;Therapeutic exercise    PT Goals (Current goals can be found in the Care Plan section)  Acute Rehab PT Goals Patient Stated Goal: go home PT Goal Formulation: With patient/family Time For Goal Achievement: 08/19/18 Potential to Achieve Goals: Good    Frequency Min 4X/week   Barriers to discharge        Co-evaluation               AM-PAC PT "6 Clicks" Mobility  Outcome Measure Help needed turning from your back to your side while in a flat bed without using bedrails?: None Help needed moving from lying on your back to sitting on the side of a flat bed without using bedrails?: None Help needed moving to and from a bed to a chair (including a wheelchair)?: A Little Help needed standing up from a chair using your arms (e.g., wheelchair or bedside chair)?: A Little Help needed to walk in hospital room?: A Little Help needed climbing 3-5 steps with a railing? : A Lot 6 Click Score: 19    End of Session Equipment Utilized During Treatment: Gait belt Activity  Tolerance: Patient tolerated treatment well Patient left: in chair;with call bell/phone within reach;with family/visitor present Nurse Communication: Mobility status PT Visit Diagnosis: Other abnormalities of gait and mobility (R26.89);Pain Pain - Right/Left: Left Pain - part of body: Ankle and joints of foot    Time: 1130-1204 PT Time Calculation (min) (ACUTE ONLY): 34 min   Charges:   PT Evaluation $PT Eval Moderate Complexity: 1 Mod PT Treatments $Gait Training: 8-22 mins        Lewis Shock, PT, DPT Acute Rehabilitation Services Pager #: [phone] Office #: [phone]   Iona Hansen 08/05/2018, 12:23 PM

## 2018-08-05 NOTE — Progress Notes (Signed)
   Subjective: 1 Day Post-Op Procedure(s) (LRB): OPEN REDUCTION INTERNAL FIXATION (ORIF) LISFRANC FRACTURE DISLOCATION LEFT FOOT (Left) SKIN GRAFT SPLIT THICKNESS LEFT FOOT (Left) Patient reports pain as mild.    Objective: Vital signs in last 24 hours: Temp:  [97.3 F (36.3 C)-98.4 F (36.9 C)] 97.8 F (36.6 C) (03/14 1246) Pulse Rate:  [67-77] 77 (03/14 1246) Resp:  [18] 18 (03/14 0834) BP: (93-113)/(54-71) 98/67 (03/14 1246) SpO2:  [96 %-100 %] 97 % (03/14 1246)  Intake/Output from previous day: 03/13 0701 - 03/14 0700 In: 1580 [P.O.:680; I.V.:800] Out: 1505 [Urine:1400; Drains:30; Blood:75] Intake/Output this shift: Total I/O In: 240 [P.O.:240] Out: -   No results for input(s): HGB in the last 72 hours. No results for input(s): WBC, RBC, HCT, PLT in the last 72 hours. No results for input(s): NA, K, CL, CO2, BUN, CREATININE, GLUCOSE, CALCIUM in the last 72 hours. No results for input(s): LABPT, INR in the last 72 hours.  dressing intact no drainage. VAC good seal.  No results found.  Assessment/Plan: 1 Day Post-Op Procedure(s) (LRB): OPEN REDUCTION INTERNAL FIXATION (ORIF) LISFRANC FRACTURE DISLOCATION LEFT FOOT (Left) SKIN GRAFT SPLIT THICKNESS LEFT FOOT (Left)  Plan:  Elevation. Still on IV ABX.    Eldred Manges 08/05/2018, 1:55 PM

## 2018-08-05 NOTE — Social Work (Signed)
CSW acknowledging consult for SNF placement. Will follow for therapy recommendations.  CM- Eulah Pont RN BSN CCM Office- [phone]   Octavio Graves, MSW, [facility] Clinical Social Work [phone]

## 2018-08-06 NOTE — Progress Notes (Signed)
Patient ID: Katelyn Chapman, female   DOB: 1965-10-26, 53 y.o.   MRN: [id number] Patient without complaints this morning.  There is 50 cc in the wound VAC canister.  Anticipate we can discharge tomorrow if she can maintain nonweightbearing on the left lower extremity.

## 2018-08-06 NOTE — Progress Notes (Signed)
Physical Therapy Treatment Patient Details Name: Katelyn Chapman MRN: [id number] DOB: Oct 06, 1965 Today's Date: 08/06/2018    History of Present Illness Pt is a 53yo female who sustained a crush injury to her L foot. On 3/2 she underwent ampuation of toes 2-4 and pinning of first ray. D/c'd home then came to ED for evaluation of wound, pt now s/p L transmet amputation. PMH: unremarkable.    PT Comments    Pt wanted to try knee scooter today. Used Teacher, English as a foreign language. Pt requires minA for wound vac management and stability due to first time using scooter. Pt tolerated it well despite it looking more painful and difficult. Pt able to amb 60' vs 20' yesterday with RW. Spouse reports he will be there to help her 24/7. Recommend HHPT to progress indep with knee scooter and educate on safety with knee scooter and wound vac if patient is going home with the wound vac. Acute PT to cont to follow.    Follow Up Recommendations  Home health PT;Supervision/Assistance - 24 hour     Equipment Recommendations  (just had a knee scooter delivered )    Recommendations for Other Services       Precautions / Restrictions Precautions Precautions: Fall Precaution Comments: L foot wound vac, spanish speaking Required Braces or Orthoses: Other Brace Other Brace: L post op shoe Restrictions Weight Bearing Restrictions: Yes LLE Weight Bearing: Non weight bearing    Mobility  Bed Mobility Overal bed mobility: Modified Independent             General bed mobility comments: HOB elevated, pt able to move L LE without assist, increased time  Transfers Overall transfer level: Needs assistance Equipment used: Rolling walker (2 wheeled) Transfers: Sit to/from [employer] Sit to Stand: Min assist Stand pivot transfers: Min assist       General transfer comment: directional verbal cues to stand up to transfer onto knee scooter, minA to steady  patient  Ambulation/Gait Ambulation/Gait assistance: Min assist Gait Distance (Feet): 60 Feet Assistive device: Rolling walker (2 wheeled) Gait Pattern/deviations: Step-to pattern Gait velocity: dec   General Gait Details: educated pt on how to use breaks and to press only through L knee, pt to push with R LE, pt required minA to steady pt due to first time on knee scooter, pt reports this is better than the regular RW despite it looking more difficult   Stairs             Wheelchair Mobility    Modified Rankin (Stroke Patients Only)       Balance Overall balance assessment: Needs assistance Sitting-balance support: No upper extremity supported;Feet supported Sitting balance-Leahy Scale: Normal     Standing balance support: During functional activity;Bilateral upper extremity supported Standing balance-Leahy Scale: Poor Standing balance comment: Pt able to maintain standing balance on one foot without UE support. Requires bil UE support for ambulation                            Cognition Arousal/Alertness: Awake/alert Behavior During Therapy: WFL for tasks assessed/performed Overall Cognitive Status: Within Functional Limits for tasks assessed                                 General Comments: used Spanish [employer] [phone]      Exercises      General Comments General comments (skin integrity,  edema, etc.): VSS, wound vac intact      Pertinent Vitals/Pain Pain Assessment: 0-10 Pain Score: 6  Pain Location: L foot Pain Descriptors / Indicators: Throbbing Pain Intervention(s): Monitored during session    Home Living                      Prior Function            PT Goals (current goals can now be found in the care plan section) Progress towards PT goals: Progressing toward goals    Frequency    Min 4X/week      PT Plan Current plan remains appropriate    Co-evaluation              AM-PAC  PT "6 Clicks" Mobility   Outcome Measure  Help needed turning from your back to your side while in a flat bed without using bedrails?: None Help needed moving from lying on your back to sitting on the side of a flat bed without using bedrails?: None Help needed moving to and from a bed to a chair (including a wheelchair)?: A Little Help needed standing up from a chair using your arms (e.g., wheelchair or bedside chair)?: A Little Help needed to walk in hospital room?: A Little Help needed climbing 3-5 steps with a railing? : A Lot 6 Click Score: 19    End of Session Equipment Utilized During Treatment: Gait belt Activity Tolerance: Patient tolerated treatment well Patient left: in chair;with call bell/phone within reach;with family/visitor present Nurse Communication: Mobility status PT Visit Diagnosis: Other abnormalities of gait and mobility (R26.89);Pain     Time: 2023-3435 PT Time Calculation (min) (ACUTE ONLY): 30 min  Charges:  $Gait Training: 23-37 mins                     Lewis Shock, PT, DPT Acute Rehabilitation Services Pager #: [phone] Office #: [phone]    Katelyn Chapman 08/06/2018, 12:30 PM

## 2018-08-06 NOTE — Plan of Care (Signed)
Problem: Education: Goal: Knowledge of General Education information will improve Description Including pain rating scale, medication(s)/side effects and non-pharmacologic comfort measures Outcome: Progressing   Problem: Health Behavior/Discharge Planning: Goal: Ability to manage health-related needs will improve Outcome: Progressing   Problem: Clinical Measurements: Goal: Respiratory complications will improve Outcome: Progressing Goal: Cardiovascular complication will be avoided Outcome: Progressing   Problem: Activity: Goal: Risk for activity intolerance will decrease Outcome: Progressing   Problem: Nutrition: Goal: Adequate nutrition will be maintained Outcome: Progressing   Problem: Elimination: Goal: Will not experience complications related to urinary retention Outcome: Progressing   Problem: Pain Managment: Goal: General experience of comfort will improve Outcome: Progressing   Problem: Safety: Goal: Ability to remain free from injury will improve Outcome: Progressing   Problem: Skin Integrity: Goal: Risk for impaired skin integrity will decrease Outcome: Progressing   Problem: Education: Goal: Knowledge of General Education information will improve Description Including pain rating scale, medication(s)/side effects and non-pharmacologic comfort measures Outcome: Progressing   Problem: Health Behavior/Discharge Planning: Goal: Ability to manage health-related needs will improve Outcome: Progressing

## 2018-08-07 ENCOUNTER — Encounter (HOSPITAL_COMMUNITY): Payer: Self-pay | Admitting: Orthopedic Surgery

## 2018-08-07 MED ORDER — OXYCODONE-ACETAMINOPHEN 5-325 MG PO TABS: 1.0000 | ORAL_TABLET | ORAL | 0 refills | Status: DC | PRN

## 2018-08-07 NOTE — Discharge Summary (Signed)
Discharge Diagnoses:  Active Problems:   Crush injury of left foot   Gangrene of left foot (HCC)   Lisfranc dislocation, left, sequela   Surgeries: Procedure(s): OPEN REDUCTION INTERNAL FIXATION (ORIF) LISFRANC FRACTURE DISLOCATION LEFT FOOT SKIN GRAFT SPLIT THICKNESS LEFT FOOT on 08/04/2018    Consultants:   Discharged Condition: Improved  Hospital Course: Katelyn Chapman is an 53 y.o. female who was admitted 08/02/2018 with a chief complaint of crush injury left foot, with a final diagnosis of Crush Injury Left Foot.  Patient was brought to the operating room on 08/04/2018 and underwent Procedure(s): OPEN REDUCTION INTERNAL FIXATION (ORIF) LISFRANC FRACTURE DISLOCATION LEFT FOOT SKIN GRAFT SPLIT THICKNESS LEFT FOOT.    Patient was given perioperative antibiotics:  Anti-infectives (From admission, onward)   Start     Dose/Rate Route Frequency Ordered Stop   08/04/18 0730  ceFAZolin (ANCEF) IVPB 2g/100 mL premix  Status:  Discontinued     2 g 200 mL/hr over 30 Minutes Intravenous To Short Stay 08/04/18 0714 08/04/18 1216   08/02/18 1930  ceFAZolin (ANCEF) IVPB 1 g/50 mL premix     1 g 100 mL/hr over 30 Minutes Intravenous Every 6 hours 08/02/18 1824 08/07/18 1929   08/02/18 1200  ceFAZolin (ANCEF) IVPB 2g/100 mL premix     2 g 200 mL/hr over 30 Minutes Intravenous On call to O.R. 08/02/18 1157 08/02/18 1646    .  Patient was given sequential compression devices, early ambulation, and aspirin for DVT prophylaxis.  Recent vital signs:  Patient Vitals for the past 24 hrs:  BP Temp Temp src Pulse Resp SpO2  08/07/18 0556 100/69 98.3 F (36.8 C) Oral 72 16 99 %  08/06/18 2055 100/66 98.5 F (36.9 C) Oral 88 14 96 %  08/06/18 1450 103/64 98.3 F (36.8 C) Oral 74 20 99 %  .  Recent laboratory studies: No results found.  Discharge Medications:   Allergies as of 08/07/2018   No Known Allergies     Medication List    STOP taking these medications   oxyCODONE 5 MG  immediate release tablet Commonly known as:  Oxy IR/ROXICODONE     TAKE these medications   acetaminophen 500 MG tablet Commonly known as:  TYLENOL Take 1 tablet (500 mg total) by mouth every 12 (twelve) hours.   aspirin 325 MG tablet Take 1 tablet (325 mg total) by mouth daily.   gabapentin 100 MG capsule Commonly known as:  NEURONTIN Take 1 capsule (100 mg total) by mouth 3 (three) times daily.   methocarbamol 750 MG tablet Commonly known as:  Robaxin-750 Take 1 tablet (750 mg total) by mouth every 6 (six) hours as needed for muscle spasms.   oxyCODONE-acetaminophen 5-325 MG tablet Commonly known as:  PERCOCET/ROXICET Take 1 tablet by mouth every 4 (four) hours as needed.            Discharge Care Instructions  (From admission, onward)         Start     Ordered   08/07/18 0000  Non weight bearing    Question Answer Comment  Laterality left   Extremity Lower      08/07/18 0646          Diagnostic Studies: Dg Foot Complete Left  Result Date: 07/24/2018 CLINICAL DATA:  Left foot injury with multiple amputations. EXAM: LEFT FOOT - COMPLETE 3+ VIEW COMPARISON:  Intraoperative fluoroscopy 07/24/2018 FINDINGS: Percutaneous 10 fixation of the first ray extending through both phalanxes and the metatarsal.  The second, third and fourth digits have been amputated at the metatarsophalangeal joint. There are oblique fractures of the proximal second and distal third metatarsals. The fifth rays intact. There is generalized soft tissue swelling. Vacuum assisted wound dressing is present over the anterior dorsum of the foot. IMPRESSION: 1. Amputation of the second, third and fourth digits at the metatarsophalangeal joint, with oblique fractures of the proximal second and distal third metatarsals. 2. Percutaneous fixation of the first ray with minimally displaced fractures of the middle and distal phalanges Electronically Signed   By: Deatra Robinson M.D.   On: 07/24/2018 19:42   Dg  Foot Complete Left  Result Date: 07/24/2018 CLINICAL DATA:  Patient's foot run over by a forklift. EXAM: DG C-ARM 61-120 MIN; LEFT FOOT - COMPLETE 3+ VIEW FLUOROSCOPY TIME:  32 seconds COMPARISON:  Left foot radiographs-earlier same day FINDINGS: Four spot fluoroscopic images of the left foot are provided for review. Provided images demonstrate the sequela of amputation of the 2nd through 5th phalanges. Interval pin fixation of the great toe extending through the first metatarsal with improved alignment of known comminuted fractures of the proximal and distal phalanges as well of the great digit as well as the base of the first metatarsal. Unchanged displaced fracture involving the mid aspect of the third metatarsal. There is persistent mild splaying of the first through fifth metatarsals. Expected adjacent soft tissue swelling.  No radiopaque foreign body. IMPRESSION: 1. Interval pin fixation of the great toe and first metatarsal with improved alignment of previously identified comminuted fractures. 2. Interval amputation of the second through fifth digits. 3. Grossly unchanged appearance and alignment of displaced third metatarsal fracture. Electronically Signed   By: Simonne Come M.D.   On: 07/24/2018 16:48   Dg Foot Complete Left  Result Date: 07/24/2018 CLINICAL DATA:  Left foot injury. Patient's foot was run over by a forklift. EXAM: LEFT FOOT - COMPLETE 3+ VIEW COMPARISON:  None. FINDINGS: There are multiple fractures as well as extensive soft tissue injury in the distal forefoot. Detail is limited by overlying bandages. There is a mildly displaced intra-articular fracture involving the base of the 1st metatarsal. There are mildly displaced extra-articular fractures involving the base of the 2nd metatarsal and the mid 3rd metatarsal shaft. There are apparent partial amputation injuries of all the distal digits with a possible exception of the 2nd digit. There are associated distal phalangeal fractures.  There is a possible fracture of the head of the 1st proximal phalanx with mild dorsal lateral subluxation of the 1st interphalangeal joint. No midfoot or hindfoot fractures are identified. There is no evidence of radiopaque foreign body. IMPRESSION: 1. Multiple acute fractures involving the 1st through 3rd metatarsals and toes as described. 2. Significant forefoot soft tissue injury with apparent partial distal amputations. 3. Bone detail limited by overlying bandages. Electronically Signed   By: Carey Bullocks M.D.   On: 07/24/2018 13:56   Dg C-arm 1-60 Min  Result Date: 07/24/2018 CLINICAL DATA:  Patient's foot run over by a forklift. EXAM: DG C-ARM 61-120 MIN; LEFT FOOT - COMPLETE 3+ VIEW FLUOROSCOPY TIME:  32 seconds COMPARISON:  Left foot radiographs-earlier same day FINDINGS: Four spot fluoroscopic images of the left foot are provided for review. Provided images demonstrate the sequela of amputation of the 2nd through 5th phalanges. Interval pin fixation of the great toe extending through the first metatarsal with improved alignment of known comminuted fractures of the proximal and distal phalanges as well of the great digit  as well as the base of the first metatarsal. Unchanged displaced fracture involving the mid aspect of the third metatarsal. There is persistent mild splaying of the first through fifth metatarsals. Expected adjacent soft tissue swelling.  No radiopaque foreign body. IMPRESSION: 1. Interval pin fixation of the great toe and first metatarsal with improved alignment of previously identified comminuted fractures. 2. Interval amputation of the second through fifth digits. 3. Grossly unchanged appearance and alignment of displaced third metatarsal fracture. Electronically Signed   By: Simonne Come M.D.   On: 07/24/2018 16:48    Patient benefited maximally from their hospital stay and there were no complications.     Disposition:  Discharge Instructions    Elevate operative extremity    Complete by:  As directed    Negative Pressure Wound Therapy - Incisional   Complete by:  As directed    Attached to the wound VAC to the portable Praveena pump for discharge.  Show patient how to plug the unit in to maintain the charge.   Non weight bearing   Complete by:  As directed    Laterality:  left   Extremity:  Lower   Post-op shoe   Complete by:  As directed      Follow-up Information    Nadara Mustard, MD. Go today.   Specialty:  Orthopedic Surgery Why:  your appointment with Dr. Lajoyce Corners is Thursday, August 10, 2018 at 1:15pm  This appointment can not be changed without contacting Eulah Pont , your worker's comp RN Case Manager Contact information: [address] [place] [address] [phone]            Signed: Nadara Mustard 08/07/2018, 6:47 AM

## 2018-08-07 NOTE — Progress Notes (Signed)
Patient ID: Katelyn Chapman, female   DOB: 1965/09/08, 53 y.o.   MRN: [id number] No new drainage in the wound VAC canister.  The dressing is clean and dry.  Plan for discharge on the portable Praveena wound VAC pump.  Patient will need instructions on plugging the unit and.  Patient is to be nonweightbearing on the left elevate her foot I will follow-up in the office in a week

## 2018-08-07 NOTE — Progress Notes (Signed)
Physical Therapy Treatment Patient Details Name: Katelyn Chapman MRN: [id number] DOB: 09/05/1965 Today's Date: 08/07/2018    History of Present Illness Pt is a 53yo female who sustained a crush injury to her L foot. On 3/2 she underwent ampuation of toes 2-4 and pinning of first ray. D/c'd home then came to ED for evaluation of wound, pt now s/p L transmet amputation. PMH: unremarkable.    PT Comments    Patient seen for mobility progression. This session focused on gait training with knee scooter and pt is progressing well. Current plan remains appropriate.    Follow Up Recommendations  Home health PT;Supervision/Assistance - 24 hour     Equipment Recommendations  None recommended by PT(just had a knee scooter delivered )    Recommendations for Other Services       Precautions / Restrictions Precautions Precautions: Fall Precaution Comments: L foot wound vac, spanish speaking Required Braces or Orthoses: Other Brace Other Brace: L post op shoe Restrictions Weight Bearing Restrictions: Yes LLE Weight Bearing: Non weight bearing    Mobility  Bed Mobility Overal bed mobility: Modified Independent             General bed mobility comments: HOB elevated  Transfers Overall transfer level: Needs assistance Equipment used: (knee scooter) Transfers: Sit to/from [employer] Sit to Stand: Min assist Stand pivot transfers: Min assist       General transfer comment: cues for safety and locking brakes on knee scooter for transfers; husband assisted to steady and to [employer]   Ambulation/Gait Ambulation/Gait assistance: Min guard Gait Distance (Feet): 180 Feet Assistive device: (knee scooter ) Gait Pattern/deviations: Step-to pattern     General Gait Details: reviewed use of breaks and safety with turns/directional changes   Stairs             Wheelchair Mobility    Modified Rankin (Stroke Patients Only)       Balance Overall  balance assessment: Needs assistance Sitting-balance support: No upper extremity supported;Feet supported Sitting balance-Leahy Scale: Normal     Standing balance support: During functional activity;Bilateral upper extremity supported Standing balance-Leahy Scale: Poor Standing balance comment: Pt able to maintain standing balance on one foot without UE support. Requires bil UE support for ambulation                            Cognition Arousal/Alertness: Awake/alert Behavior During Therapy: WFL for tasks assessed/performed Overall Cognitive Status: Within Functional Limits for tasks assessed                                 General Comments: [employer] video interpreter not working; husband translated      Exercises      General Comments        Pertinent Vitals/Pain Pain Assessment: No/denies pain    Home Living                      Prior Function            PT Goals (current goals can now be found in the care plan section) Progress towards PT goals: Progressing toward goals    Frequency    Min 4X/week      PT Plan Current plan remains appropriate    Co-evaluation              AM-PAC PT "6  Clicks" Mobility   Outcome Measure  Help needed turning from your back to your side while in a flat bed without using bedrails?: None Help needed moving from lying on your back to sitting on the side of a flat bed without using bedrails?: None Help needed moving to and from a bed to a chair (including a wheelchair)?: A Little Help needed standing up from a chair using your arms (e.g., wheelchair or bedside chair)?: A Little Help needed to walk in hospital room?: A Little Help needed climbing 3-5 steps with a railing? : A Lot 6 Click Score: 19    End of Session Equipment Utilized During Treatment: Gait belt Activity Tolerance: Patient tolerated treatment well Patient left: with call bell/phone within reach;with family/visitor  present;in bed Nurse Communication: Mobility status PT Visit Diagnosis: Other abnormalities of gait and mobility (R26.89);Pain     Time: 1010-1027 PT Time Calculation (min) (ACUTE ONLY): 17 min  Charges:  $Gait Training: 8-22 mins                     Erline Levine, PTA Acute Rehabilitation Services Pager: [phone] Office: [phone]     Carolynne Edouard 08/07/2018, 10:45 AM

## 2018-08-07 NOTE — Plan of Care (Addendum)
  Problem: Education: Goal: Knowledge of General Education information will improve Description Including pain rating scale, medication(s)/side effects and non-pharmacologic comfort measures Outcome: Adequate for Discharge   Problem: Health Behavior/Discharge Planning: Goal: Ability to manage health-related needs will improve Outcome: Adequate for Discharge   Problem: Clinical Measurements: Goal: Ability to maintain clinical measurements within normal limits will improve Outcome: Adequate for Discharge Goal: Will remain free from infection Outcome: Adequate for Discharge Goal: Diagnostic test results will improve Outcome: Adequate for Discharge Goal: Respiratory complications will improve Outcome: Adequate for Discharge Goal: Cardiovascular complication will be avoided Outcome: Adequate for Discharge   Problem: Activity: Goal: Risk for activity intolerance will decrease Outcome: Adequate for Discharge   Problem: Nutrition: Goal: Adequate nutrition will be maintained Outcome: Adequate for Discharge   Problem: Elimination: Goal: Will not experience complications related to bowel motility Outcome: Adequate for Discharge Goal: Will not experience complications related to urinary retention Outcome: Adequate for Discharge   Problem: Pain Managment: Goal: General experience of comfort will improve Outcome: Adequate for Discharge   Problem: Safety: Goal: Ability to remain free from injury will improve Outcome: Adequate for Discharge   Problem: Skin Integrity: Goal: Risk for impaired skin integrity will decrease Outcome: Adequate for Discharge   Problem: Education: Goal: Knowledge of General Education information will improve Description Including pain rating scale, medication(s)/side effects and non-pharmacologic comfort measures Outcome: Adequate for Discharge   Problem: Health Behavior/Discharge Planning: Goal: Ability to manage health-related needs will  improve Outcome: Adequate for Discharge   Problem: Clinical Measurements: Goal: Ability to maintain clinical measurements within normal limits will improve Outcome: Adequate for Discharge Goal: Will remain free from infection Outcome: Adequate for Discharge Goal: Diagnostic test results will improve Outcome: Adequate for Discharge Goal: Respiratory complications will improve Outcome: Adequate for Discharge Goal: Cardiovascular complication will be avoided Outcome: Adequate for Discharge   Problem: Activity: Goal: Risk for activity intolerance will decrease Outcome: Adequate for Discharge   Problem: Nutrition: Goal: Adequate nutrition will be maintained Outcome: Adequate for Discharge   Problem: Coping: Goal: Level of anxiety will decrease Outcome: Adequate for Discharge   Problem: Elimination: Goal: Will not experience complications related to bowel motility Outcome: Adequate for Discharge Goal: Will not experience complications related to urinary retention Outcome: Adequate for Discharge   Problem: Pain Managment: Goal: General experience of comfort will improve Outcome: Adequate for Discharge   Problem: Safety: Goal: Ability to remain free from injury will improve Outcome: Adequate for Discharge   Problem: Skin Integrity: Goal: Risk for impaired skin integrity will decrease Outcome: Adequate for Discharge    DC summary given to patient and spouse in english and spanish. PT and spouse educated on follow up appointments, medication regimen, and site care. VSS. Wound vac changed to home portable vac. Dressing changed, CDI. IV ancef given prior to DC. PIV DC, hemostasis achieved. VSS. CM aware of DC. All belongings sent home with patient and spouse at time of DC.  1520:  Pt escorted by NT via wheelchair to private vehicle driven by spouse.   Will continue to monitor until time of DC.

## 2018-08-07 NOTE — Care Management (Addendum)
CM faxed discharge summary to patient's worker's comp RN CM Betsy Coder [phone] (fax). Per Marcelino Duster, patient has all necessary DME, knee scooter will be delivered to her home as arranged by worker's comp RN Case Production designer, theatre/television/film.    Vance Peper, RN BSN Case Manager [phone]

## 2018-08-08 ENCOUNTER — Telehealth (INDEPENDENT_AMBULATORY_CARE_PROVIDER_SITE_OTHER): Payer: Self-pay | Admitting: Orthopedic Surgery

## 2018-08-08 ENCOUNTER — Other Ambulatory Visit (INDEPENDENT_AMBULATORY_CARE_PROVIDER_SITE_OTHER): Payer: Self-pay | Admitting: Orthopedic Surgery

## 2018-08-08 MED ORDER — OXYCODONE-ACETAMINOPHEN 5-325 MG PO TABS: 1.0000 | ORAL_TABLET | ORAL | 0 refills | Status: DC | PRN

## 2018-08-08 NOTE — Telephone Encounter (Signed)
rx sent to [facility] on [address]

## 2018-08-08 NOTE — Telephone Encounter (Signed)
Received call from [facility]  (Case Manager) with [place] [employer] asking Autumn to call her concerning patient. Marcelino Duster said Rx for the patient was sent to the wrong location. Marcelino Duster said patient's husband was very upset. The number to contact Marcelino Duster is [phone]

## 2018-08-08 NOTE — Telephone Encounter (Signed)
Email I received last night form patient's case manager:  Elnita Maxwell,  I checked on Ms. Mendoza. She is home; however, the narcotic pain medication was called into the [place] on [place] and she lives in [place].  I called the [facility] location and they cannot transfer the Oxycodone because it is a narcotic and it will need to be called in again to the [place] location: [phone].  Please have Dr. Lajoyce Corners or his PA escript this first thing in the morning!  And please respond to this email to confirm receipt.  Kindest regards, Eulah Pont, BSN, RN, CCM, QRP  [place] Case Public librarian  [address]  [place], [place] [address]  Phone: [phone]  Fax: [phone] to Attention of Eulah Pont  Email: [email or web address]

## 2018-08-08 NOTE — Telephone Encounter (Signed)
Please see this message below and send rx to [facility] please.

## 2018-08-08 NOTE — Telephone Encounter (Signed)
Correction...[facility]ro[facility] in chart I removed the [place] location and added the [place] location [facility] [place].   I called and [place] case manager to advise that the rx was sent to the pharmacy [facility] in the chart [facility] [place]. I advised that I had removed this from chart and confirmed the information for the [employer] location and also advised that I had sent a message to Dr. Lajoyce Corners address after he is out of surgery this morning. To call with any other questions otherwise she has a follow up on this Thursday.

## 2018-08-09 ENCOUNTER — Telehealth (INDEPENDENT_AMBULATORY_CARE_PROVIDER_SITE_OTHER): Payer: Self-pay

## 2018-08-09 NOTE — Telephone Encounter (Signed)
Fax received from cover my meds on this pt for prior auth for her Percocet 5/325. This is a work comp patient and does not need through insurance. [facility] uses [facility] in [place]. Let me know if there is anything else that I need to do.

## 2018-08-09 NOTE — Telephone Encounter (Signed)
information written on fax and [facility].

## 2018-08-10 ENCOUNTER — Other Ambulatory Visit: Payer: Self-pay

## 2018-08-10 ENCOUNTER — Ambulatory Visit (INDEPENDENT_AMBULATORY_CARE_PROVIDER_SITE_OTHER): Payer: Worker's Compensation | Admitting: Orthopedic Surgery

## 2018-08-10 ENCOUNTER — Other Ambulatory Visit (INDEPENDENT_AMBULATORY_CARE_PROVIDER_SITE_OTHER): Payer: Self-pay

## 2018-08-10 ENCOUNTER — Encounter (INDEPENDENT_AMBULATORY_CARE_PROVIDER_SITE_OTHER): Payer: Self-pay | Admitting: Orthopedic Surgery

## 2018-08-10 VITALS — Ht 60.0 in | Wt 149.0 lb

## 2018-08-10 DIAGNOSIS — S93325S Dislocation of tarsometatarsal joint of left foot, sequela: Secondary | ICD-10-CM

## 2018-08-10 DIAGNOSIS — S9782XS Crushing injury of left foot, sequela: Secondary | ICD-10-CM

## 2018-08-10 MED ORDER — SILVER SULFADIAZINE 1 % EX CREA: 1.0000 "application " | TOPICAL_CREAM | Freq: Every day | CUTANEOUS | 3 refills | Status: DC

## 2018-08-10 NOTE — Progress Notes (Signed)
Office Visit Note   Patient: Katelyn Chapman           Date of Birth: 07/26/1965           MRN: [id number] Visit Date: 08/10/2018              Requested by: Oswaldo Conroy, MD [address] [place], [place] [address] PCP: Oswaldo Conroy, MD  Chief Complaint  Patient presents with  . Left Foot - Routine Post Op    08/04/2018 left foot ORIF  Lisfranc fracture dislocation toe amputations and STSG      HPI: Patient is a 53 year old woman who presents follow-up status post open reduction internal fixation Lisfranc fracture dislocation as well as limb salvage intervention with a transmetatarsal amputation split-thickness skin graft and a wound VAC.  The wound VAC is removed today patient has been nonweightbearing with a walker.  Assessment & Plan: Visit Diagnoses:  1. Crushing injury of left foot, sequela   2. Lisfranc dislocation, left, sequela     Plan: We will set up home health nursing twice a week for Silvadene dressing changes.  A prescription was called in for Silvadene patient was given instructions to wash her foot daily with shower water she will get a bench for the shower prescription was written she will dry the foot apply Silvadene to gauze applied Ace wrap and this dressing change daily.  Follow-up closely weekly.  Follow-Up Instructions: Return in about 1 week (around 08/17/2018).   Ortho Exam  Patient is alert, oriented, no adenopathy, well-dressed, normal affect, normal respiratory effort. Examination patient has excellent granulation tissue and healthy viable split-thickness skin graft.  She is developing an equinus contracture and the importance of dorsiflexion was reinforced.  Will apply Silvadene dressing today.  Imaging: No results found.   Labs: No results found for: HGBA1C, ESRSEDRATE, CRP, LABURIC, REPTSTATUS, GRAMSTAIN, CULT, LABORGA   Lab Results  Component Value Date   ALBUMIN 4.1 07/24/2018    Body mass index is 29.1  kg/m.  Orders:  No orders of the defined types were placed in this encounter.  No orders of the defined types were placed in this encounter.    Procedures: No procedures performed  Clinical Data: No additional findings.  ROS:  All other systems negative, except as noted in the HPI. Review of Systems  Objective: Vital Signs: Ht 5' (1.524 m)   Wt 149 lb (67.6 kg)   LMP 07/28/2018   BMI 29.10 kg/m   Specialty Comments:  No specialty comments available.  PMFS History: Patient Active Problem List   Diagnosis Date Noted  . Lisfranc dislocation, left, sequela   . Gangrene of left foot (HCC)   . Crush injury of left foot 07/24/2018  . 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 Displaced fracture of first metatarsal bone, left foot, initial encounter for open fracture 07/24/2018  . Displaced fracture of second metatarsal bone, left foot, initial encounter for open fracture 07/24/2018  . Displaced fracture of third metatarsal bone, left foot, initial encounter for open fracture 07/24/2018   Past Medical History:  Diagnosis Date  . Medical history non-contributory     History reviewed. No pertinent family history.  Past Surgical History:  Procedure Laterality Date  . AMPUTATION TOE Left 07/24/2018   Procedure: AMPUTATION TOE;  Surgeon: Roby Lofts, MD;  Location: [facility];  Service: Orthopedics;  Laterality: Left;  . CHOLECYSTECTOMY    .  I&D EXTREMITY Left 07/24/2018   Procedure: IRRIGATION AND DEBRIDEMENT EXTREMITY;  Surgeon: Roby Lofts, MD;  Location: [facility];  Service: Orthopedics;  Laterality: Left;  . I&D EXTREMITY Left 08/02/2018   Procedure: DEBRIDEMENT LEFT FOOT, PLACE VAC;  Surgeon: Nadara Mustard, MD;  Location: [facility];  Service: Orthopedics;  Laterality: Left;  . ORIF ANKLE FRACTURE Left 08/04/2018   Procedure: OPEN REDUCTION INTERNAL FIXATION (ORIF) LISFRANC FRACTURE DISLOCATION LEFT FOOT;  Surgeon: Nadara Mustard, MD;  [facility]: [facility];  Service: Orthopedics;  Laterality: Left;  . ORIF TOE FRACTURE Left 07/24/2018   Procedure: OPEN REDUCTION INTERNAL FIXATION (ORIF) METATARSAL (TOE) FRACTURE;  Surgeon: Roby Lofts, MD;  [facility];  Service: Orthopedics;  Laterality: Left;  . SKIN SPLIT GRAFT Left 08/04/2018   Procedure: SKIN GRAFT SPLIT THICKNESS LEFT FOOT;  Surgeon: Nadara Mustard, MD;  Location: [facility] OR;  Service: Orthopedics;  Laterality: Left;   Social History   Occupational History  . Not on file  Tobacco Use  . Smoking status: Never Smoker  . Smokeless tobacco: Never Used  Substance and Sexual Activity  . Alcohol use: Never    Frequency: Never  . Drug use: Never  . Sexual activity: Not on file

## 2018-08-15 ENCOUNTER — Telehealth (INDEPENDENT_AMBULATORY_CARE_PROVIDER_SITE_OTHER): Payer: Self-pay | Admitting: Orthopedic Surgery

## 2018-08-15 NOTE — Telephone Encounter (Signed)
Katelyn Chapman the pt case manager called in said she would like a call back from autumn.  [phone]

## 2018-08-16 ENCOUNTER — Ambulatory Visit (INDEPENDENT_AMBULATORY_CARE_PROVIDER_SITE_OTHER): Payer: Worker's Compensation | Admitting: Orthopedic Surgery

## 2018-08-16 ENCOUNTER — Other Ambulatory Visit: Payer: Self-pay

## 2018-08-16 ENCOUNTER — Telehealth (INDEPENDENT_AMBULATORY_CARE_PROVIDER_SITE_OTHER): Payer: Self-pay

## 2018-08-16 ENCOUNTER — Encounter (INDEPENDENT_AMBULATORY_CARE_PROVIDER_SITE_OTHER): Payer: Self-pay | Admitting: Orthopedic Surgery

## 2018-08-16 VITALS — Ht 60.0 in | Wt 149.0 lb

## 2018-08-16 DIAGNOSIS — S9782XS Crushing injury of left foot, sequela: Secondary | ICD-10-CM

## 2018-08-16 NOTE — Progress Notes (Signed)
Office Visit Note   Patient: Katelyn Chapman           Date of Birth: 1966/01/08           MRN: [id number] Visit Date: 08/16/2018              Requested by: Oswaldo Conroy, MD [address] [place], [place] [address] PCP: Oswaldo Conroy, MD  Chief Complaint  Patient presents with  . Left Foot - Routine Post Op      HPI: Patient is a 53 year old woman who presents follow-up status post open reduction internal fixation Lisfranc fracture dislocation as well as limb salvage intervention with a transmetatarsal amputation split-thickness skin graft and a wound VAC. Has been having silvadene dressing changes with HH nursing. Dressing removed today. Assessment & Plan: Visit Diagnoses:  1. Crushing injury of left foot, sequela     Plan: She will follow in the office in 2 days for dressing change. Silver medical compression garment applied with direct skin contact today. Remove tomorrow and don new garment. Both provided in office today.  Follow-Up Instructions: No follow-ups on file.   Ortho Exam  Patient is alert, oriented, no adenopathy, well-dressed, normal affect, normal respiratory effort. Examination patient has less granulation today. Graft material in place. Does have surrounding maceration to surrounding skin and plantar aspect of foot. Too much moisture to wound bed and surrounding tissue. Scant bleeding today. No odor. No erythema no warmth. No concerning sign of infection.  Imaging: No results found. No images are attached to the encounter.   Labs: No results found for: HGBA1C, ESRSEDRATE, CRP, LABURIC, REPTSTATUS, GRAMSTAIN, CULT, LABORGA   Lab Results  Component Value Date   ALBUMIN 4.1 07/24/2018    Body mass index is 29.1 kg/m.  Orders:  No orders of the defined types were placed in this encounter.  No orders of the defined types were placed in this encounter.    Procedures: No procedures performed  Clinical Data: No  additional findings.  ROS:  All other systems negative, except as noted in the HPI. Review of Systems  Constitutional: Negative for chills and fever.  Skin: Positive for wound. Negative for color change.    Objective: Vital Signs: Ht 5' (1.524 m)   Wt 149 lb (67.6 kg)   LMP 07/28/2018   BMI 29.10 kg/m   Specialty Comments:  No specialty comments available.  PMFS History: Patient Active Problem List   Diagnosis Date Noted  . Lisfranc dislocation, left, sequela   . Gangrene of left foot (HCC)   . Crush injury of left foot 07/24/2018  . 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 Displaced fracture of first metatarsal bone, left foot, initial encounter for open fracture 07/24/2018  . Displaced fracture of second metatarsal bone, left foot, initial encounter for open fracture 07/24/2018  . Displaced fracture of third metatarsal bone, left foot, initial encounter for open fracture 07/24/2018   Past Medical History:  Diagnosis Date  . Medical history non-contributory     No family history on file.  Past Surgical History:  Procedure Laterality Date  . AMPUTATION TOE Left 07/24/2018   Procedure: AMPUTATION TOE;  Surgeon: Roby Lofts, MD;  [facility];  Service: Orthopedics;  Laterality: Left;  . CHOLECYSTECTOMY    . I&D EXTREMITY Left 07/24/2018   Procedure: IRRIGATION AND DEBRIDEMENT EXTREMITY;  Surgeon: Roby Lofts, MD;  [facility]: [facility];  Service:  Orthopedics;  Laterality: Left;  . I&D EXTREMITY Left 08/02/2018   Procedure: DEBRIDEMENT LEFT FOOT, PLACE VAC;  Surgeon: Nadara Mustard, MD;  [facility];  Service: Orthopedics;  Laterality: Left;  . ORIF ANKLE FRACTURE Left 08/04/2018   Procedure: OPEN REDUCTION INTERNAL FIXATION (ORIF) LISFRANC FRACTURE DISLOCATION LEFT FOOT;  Surgeon: Nadara Mustard, MD;  [facility];  Service: Orthopedics;  Laterality: Left;  . ORIF TOE FRACTURE Left 07/24/2018   Procedure:  OPEN REDUCTION INTERNAL FIXATION (ORIF) METATARSAL (TOE) FRACTURE;  Surgeon: Roby Lofts, MD;  [facility];  Service: Orthopedics;  Laterality: Left;  . SKIN SPLIT GRAFT Left 08/04/2018   Procedure: SKIN GRAFT SPLIT THICKNESS LEFT FOOT;  Surgeon: Nadara Mustard, MD;  Location: [facility] OR;  Service: Orthopedics;  Laterality: Left;   Social History   Occupational History  . Not on file  Tobacco Use  . Smoking status: Never Smoker  . Smokeless tobacco: Never Used  Substance and Sexual Activity  . Alcohol use: Never    Frequency: Never  . Drug use: Never  . Sexual activity: Not on file

## 2018-08-16 NOTE — Telephone Encounter (Signed)
Pt has an appt today.  

## 2018-08-16 NOTE — Telephone Encounter (Signed)
Katelyn Chapman, Nurse for patient called stating that [facility] went to out to see patient last night and she was advised that patient's left foot has green drainage and an odor.  CB# [phone].  Patient has an appointment scheduled today to see Dr. Lajoyce Corners.

## 2018-08-17 ENCOUNTER — Ambulatory Visit (INDEPENDENT_AMBULATORY_CARE_PROVIDER_SITE_OTHER): Payer: Self-pay | Admitting: Orthopedic Surgery

## 2018-08-17 NOTE — Telephone Encounter (Signed)
Called pt and asked pre-screening COVID-19 questions and pt answered no to all questions.  

## 2018-08-18 ENCOUNTER — Telehealth (INDEPENDENT_AMBULATORY_CARE_PROVIDER_SITE_OTHER): Payer: Self-pay

## 2018-08-18 ENCOUNTER — Encounter (INDEPENDENT_AMBULATORY_CARE_PROVIDER_SITE_OTHER): Payer: Self-pay | Admitting: Physician Assistant

## 2018-08-18 ENCOUNTER — Other Ambulatory Visit: Payer: Self-pay

## 2018-08-18 ENCOUNTER — Ambulatory Visit (INDEPENDENT_AMBULATORY_CARE_PROVIDER_SITE_OTHER): Payer: Worker's Compensation | Admitting: Physician Assistant

## 2018-08-18 VITALS — Ht 60.0 in | Wt 149.0 lb

## 2018-08-18 DIAGNOSIS — S9782XS Crushing injury of left foot, sequela: Secondary | ICD-10-CM

## 2018-08-18 DIAGNOSIS — S93325S Dislocation of tarsometatarsal joint of left foot, sequela: Secondary | ICD-10-CM

## 2018-08-18 NOTE — Progress Notes (Signed)
Office Visit Note   Patient: Katelyn Chapman           Date of Birth: 05-Sep-1965           MRN: [id number] Visit Date: 08/18/2018              Requested by: Oswaldo Conroy, MD [address] [place], [place] [address] PCP: Oswaldo Conroy, MD  Chief Complaint  Patient presents with  . Left Foot - Routine Post Op      HPI: The patient is a 53 yo woman who is seen for post operative follow up following ORIF of her Lisfranc fracture dislocation as well as limb salvage intervention with a transmetatarsal amputation and split thickness skin graft and wound VAC placement.  She has had a silver stump shrinker stocking on for the past 2 days. She reports moderate pain. She is non weight bearing and using a walker.   Assessment & Plan: Visit Diagnoses:  1. Crushing injury of left foot, sequela   2. Lisfranc dislocation, left, sequela     Plan: The patient does not want the shrinker reapplied today as this was very painful to remove and she does not have the 2nd stocking with her today. We are going to use some silver compression socks cut up to cover the grafted area and then kerlix and Ace wrap to secure to the wound bed. She is going to continue to off load and elevate as much as possible. She will follow up next Tuesday afternoon.   Follow-Up Instructions: Return in about 4 days (around 08/22/2018) for in the afternoon.   Ortho Exam  Patient is alert, oriented, no adenopathy, well-dressed, normal affect, normal respiratory effort. The graft site is adherent to the wound bed with staples and sutures in place. Minimal serosanguinous drainage from the distal transmetatarsal incision. Excellent pedal pulses. Mild edema.  There are new areas of granulation coming through the meshed areas of graft. No signs of cellulitis of the foot.     Imaging: No results found. No images are attached to the encounter.  Labs: No results found for: HGBA1C, ESRSEDRATE, CRP,  LABURIC, REPTSTATUS, GRAMSTAIN, CULT, LABORGA   Lab Results  Component Value Date   ALBUMIN 4.1 07/24/2018    Body mass index is 29.1 kg/m.  Orders:  No orders of the defined types were placed in this encounter.  No orders of the defined types were placed in this encounter.    Procedures: No procedures performed  Clinical Data: No additional findings.  ROS:  All other systems negative, except as noted in the HPI. Review of Systems  Objective: Vital Signs: Ht 5' (1.524 m)   Wt 149 lb (67.6 kg)   LMP 07/28/2018   BMI 29.10 kg/m   Specialty Comments:  No specialty comments available.  PMFS History: Patient Active Problem List   Diagnosis Date Noted  . Lisfranc dislocation, left, sequela   . Gangrene of left foot (HCC)   . Crush injury of left foot 07/24/2018  . 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 Displaced fracture of first metatarsal bone, left foot, initial encounter for open fracture 07/24/2018  . Displaced fracture of second metatarsal bone, left foot, initial encounter for open fracture 07/24/2018  . Displaced fracture of third metatarsal bone, left foot, initial encounter for open fracture 07/24/2018   Past Medical History:  Diagnosis Date  . Medical history non-contributory  History reviewed. No pertinent family history.  Past Surgical History:  Procedure Laterality Date  . AMPUTATION TOE Left 07/24/2018   Procedure: AMPUTATION TOE;  Surgeon: Roby Lofts, MD;  [facility]: [facility];  Service: Orthopedics;  Laterality: Left;  . CHOLECYSTECTOMY    . I&D EXTREMITY Left 07/24/2018   Procedure: IRRIGATION AND DEBRIDEMENT EXTREMITY;  Surgeon: Roby Lofts, MD;  [facility]: [facility];  Service: Orthopedics;  Laterality: Left;  . I&D EXTREMITY Left 08/02/2018   Procedure: DEBRIDEMENT LEFT FOOT, PLACE VAC;  Surgeon: Nadara Mustard, MD;  Location: [facility];  Service: Orthopedics;  Laterality: Left;  . ORIF  ANKLE FRACTURE Left 08/04/2018   Procedure: OPEN REDUCTION INTERNAL FIXATION (ORIF) LISFRANC FRACTURE DISLOCATION LEFT FOOT;  Surgeon: Nadara Mustard, MD;  [facility]: [facility];  Service: Orthopedics;  Laterality: Left;  . ORIF TOE FRACTURE Left 07/24/2018   Procedure: OPEN REDUCTION INTERNAL FIXATION (ORIF) METATARSAL (TOE) FRACTURE;  Surgeon: Roby Lofts, MD;  [facility]: [facility];  Service: Orthopedics;  Laterality: Left;  . SKIN SPLIT GRAFT Left 08/04/2018   Procedure: SKIN GRAFT SPLIT THICKNESS LEFT FOOT;  Surgeon: Nadara Mustard, MD;  [facility] OR;  Service: Orthopedics;  Laterality: Left;   Social History   Occupational History  . Not on file  Tobacco Use  . Smoking status: Never Smoker  . Smokeless tobacco: Never Used  Substance and Sexual Activity  . Alcohol use: Never    Frequency: Never  . Drug use: Never  . Sexual activity: Not on file

## 2018-08-18 NOTE — Telephone Encounter (Signed)
Today's office visit note faxed to case worker Eulah Pont fax # [phone]

## 2018-08-21 ENCOUNTER — Telehealth (INDEPENDENT_AMBULATORY_CARE_PROVIDER_SITE_OTHER): Payer: Self-pay

## 2018-08-21 ENCOUNTER — Telehealth (INDEPENDENT_AMBULATORY_CARE_PROVIDER_SITE_OTHER): Payer: Self-pay | Admitting: *Deleted

## 2018-08-21 NOTE — Telephone Encounter (Signed)
Called pt and they answered no to all covid-19 pre screening questions.  

## 2018-08-21 NOTE — Telephone Encounter (Signed)
Faxed the 08/16/18 and 08/18/18 office note to case mgr per her request. She did not get my fax last week where I faxed her the 08/16/18 office note through Epic.

## 2018-08-22 ENCOUNTER — Other Ambulatory Visit: Payer: Self-pay

## 2018-08-22 ENCOUNTER — Encounter (INDEPENDENT_AMBULATORY_CARE_PROVIDER_SITE_OTHER): Payer: Self-pay | Admitting: Orthopedic Surgery

## 2018-08-22 ENCOUNTER — Ambulatory Visit (INDEPENDENT_AMBULATORY_CARE_PROVIDER_SITE_OTHER): Payer: Worker's Compensation | Admitting: Orthopedic Surgery

## 2018-08-22 VITALS — Ht 60.0 in | Wt 149.0 lb

## 2018-08-22 DIAGNOSIS — S9782XS Crushing injury of left foot, sequela: Secondary | ICD-10-CM

## 2018-08-22 MED ORDER — OXYCODONE-ACETAMINOPHEN 5-325 MG PO TABS: 1.0000 | ORAL_TABLET | Freq: Three times a day (TID) | ORAL | 0 refills | Status: DC | PRN

## 2018-08-22 NOTE — Progress Notes (Signed)
Office Visit Note   Patient: Katelyn Chapman           Date of Birth: 1966/01/07           MRN: [id number] Visit Date: 08/22/2018              Requested by: Oswaldo Conroy, MD [address] [place], [place] [address] PCP: Oswaldo Conroy, MD  Chief Complaint  Patient presents with  . Left Foot - Routine Post Op    08/04/2018 ORIF left foot w/lisfranc fx      HPI: Patient is a 53 year old woman status post crush injury to her left foot with skin graft and internal fixation for the Lisfranc fracture dislocation.  Patient states she felt the sock dressing was sticking to her foot so she cut up the sock.  She states the other sock was thrown away.  Patient no longer has assistance at home her husband has returned to work.  Patient was seen with the interpreter present and with the social worker on face time.  Assessment & Plan: Visit Diagnoses:  1. Crushing injury of left foot, sequela     Plan: Will request home health nursing for dressing change 3 times a week the foot is to be washed with soap and water pat it dry and apply the stump shrinker directly against the skin.  This should be changed 3 times a week.  The importance of elevation with her foot level with her heart was discussed and the importance of working on ankle dorsiflexion.  Follow-Up Instructions: Return in about 2 weeks (around 09/05/2018).   Ortho Exam  Patient is alert, oriented, no adenopathy, well-dressed, normal affect, normal respiratory effort. Examination patient has excellent take of the skin graft.  There is no redness no cellulitis no signs of infection.  She does have some hyper granulation tissue and this was touched with silver nitrate.  We will harvest the sutures and staples today.  Patient was given 2 new stump shrinker size 2 extra-large.  Care of the stump triggers was discussed to wash in the washing machine on cold or warm water and drying the dryer at low heat.   Patient is to maintain nonweightbearing on the left foot.  Imaging: No results found.   Labs: No results found for: HGBA1C, ESRSEDRATE, CRP, LABURIC, REPTSTATUS, GRAMSTAIN, CULT, LABORGA   Lab Results  Component Value Date   ALBUMIN 4.1 07/24/2018    Body mass index is 29.1 kg/m.  Orders:  No orders of the defined types were placed in this encounter.  Meds ordered this encounter  Medications  . oxyCODONE-acetaminophen (PERCOCET/ROXICET) 5-325 MG tablet    Sig: Take 1 tablet by mouth every 8 (eight) hours as needed.    Dispense:  20 tablet    Refill:  0     Procedures: No procedures performed  Clinical Data: No additional findings.  ROS:  All other systems negative, except as noted in the HPI. Review of Systems  Objective: Vital Signs: Ht 5' (1.524 m)   Wt 149 lb (67.6 kg)   LMP 07/28/2018   BMI 29.10 kg/m   Specialty Comments:  No specialty comments available.  PMFS History: Patient Active Problem List   Diagnosis Date Noted  . Lisfranc dislocation, left, sequela   . Gangrene of left foot (HCC)   . Crush injury of left foot 07/24/2018  . 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  Displaced fracture of first metatarsal bone, left foot, initial encounter for open fracture 07/24/2018  . Displaced fracture of second metatarsal bone, left foot, initial encounter for open fracture 07/24/2018  . Displaced fracture of third metatarsal bone, left foot, initial encounter for open fracture 07/24/2018   Past Medical History:  Diagnosis Date  . Medical history non-contributory     History reviewed. No pertinent family history.  Past Surgical History:  Procedure Laterality Date  . AMPUTATION TOE Left 07/24/2018   Procedure: AMPUTATION TOE;  Surgeon: Roby Lofts, MD;  [facility];  Service: Orthopedics;  Laterality: Left;  . CHOLECYSTECTOMY    . I&D EXTREMITY Left 07/24/2018   Procedure: IRRIGATION  AND DEBRIDEMENT EXTREMITY;  Surgeon: Roby Lofts, MD;  [facility]: [facility];  Service: Orthopedics;  Laterality: Left;  . I&D EXTREMITY Left 08/02/2018   Procedure: DEBRIDEMENT LEFT FOOT, PLACE VAC;  Surgeon: Nadara Mustard, MD;  [facility]: [facility];  Service: Orthopedics;  Laterality: Left;  . ORIF ANKLE FRACTURE Left 08/04/2018   Procedure: OPEN REDUCTION INTERNAL FIXATION (ORIF) LISFRANC FRACTURE DISLOCATION LEFT FOOT;  Surgeon: Nadara Mustard, MD;  [facility]: [facility];  Service: Orthopedics;  Laterality: Left;  . ORIF TOE FRACTURE Left 07/24/2018   Procedure: OPEN REDUCTION INTERNAL FIXATION (ORIF) METATARSAL (TOE) FRACTURE;  Surgeon: Roby Lofts, MD;  [facility];  Service: Orthopedics;  Laterality: Left;  . SKIN SPLIT GRAFT Left 08/04/2018   Procedure: SKIN GRAFT SPLIT THICKNESS LEFT FOOT;  Surgeon: Nadara Mustard, MD;  Location[facility] OR;  Service: Orthopedics;  Laterality: Left;   Social History   Occupational History  . Not on file  Tobacco Use  . Smoking status: Never Smoker  . Smokeless tobacco: Never Used  Substance and Sexual Activity  . Alcohol use: Never    Frequency: Never  . Drug use: Never  . Sexual activity: Not on file

## 2018-08-23 ENCOUNTER — Telehealth (INDEPENDENT_AMBULATORY_CARE_PROVIDER_SITE_OTHER): Payer: Self-pay

## 2018-08-23 NOTE — Telephone Encounter (Signed)
Received a prior auth for the pt's Percocet 5/325. This is a work comp pt so no prior [place] needed. Please advise what I should do for this pt. Thanks

## 2018-08-23 NOTE — Telephone Encounter (Signed)
-----   Message from Cruzita Lederer, [place] sent at 08/21/2018 12:11 PM EDT ----- Email tomorrows office note and any orders to case manager Eulah Pont

## 2018-08-23 NOTE — Telephone Encounter (Signed)
Emailed yesterdays office note to [employer] per her request ([email or web address])

## 2018-08-29 ENCOUNTER — Other Ambulatory Visit: Payer: Self-pay

## 2018-08-29 ENCOUNTER — Encounter (INDEPENDENT_AMBULATORY_CARE_PROVIDER_SITE_OTHER): Payer: Self-pay | Admitting: Orthopedic Surgery

## 2018-08-29 ENCOUNTER — Ambulatory Visit (INDEPENDENT_AMBULATORY_CARE_PROVIDER_SITE_OTHER): Payer: Worker's Compensation | Admitting: Orthopedic Surgery

## 2018-08-29 VITALS — Ht 60.0 in | Wt 149.0 lb

## 2018-08-29 DIAGNOSIS — S9782XS Crushing injury of left foot, sequela: Secondary | ICD-10-CM

## 2018-08-29 DIAGNOSIS — S93325S Dislocation of tarsometatarsal joint of left foot, sequela: Secondary | ICD-10-CM

## 2018-08-29 NOTE — Progress Notes (Signed)
Office Visit Note   Patient: Katelyn Chapman           Date of Birth: 01-07-1966           MRN: [id number] Visit Date: 08/29/2018              Requested by: Oswaldo Conroy, MD [address] [place], [place] [address] PCP: Oswaldo Conroy, MD  Chief Complaint  Patient presents with  . Left Foot - Routine Post Op    08/04/2018 ORIF Lisfranc fx      HPI: Patient is a 53 year old woman status post split-thickness skin graft left foot from crush injury and internal fixation Lisfranc fracture.  Patient states that home health nursing has not been visiting and she did not notify anybody of this.  Assessment & Plan: Visit Diagnoses:  1. Crushing injury of left foot, sequela   2. Lisfranc dislocation, left, sequela     Plan: The remainder of the sutures and staples were removed.  Patient will start wearing the medical compression stocking a new one was provided she will wear this around the clock change it daily to wash with soap and water and apply a new clean wrap.  She will wash the old 1.  Patient is seen with an interpreter she states she understands.  The case worker was skyped and she will have the home health nursing visit the patient to ensure that she is compliant with dressing changes and that she understands proper care.  Follow-Up Instructions: Return in about 1 week (around 09/05/2018).   Ortho Exam  Patient is alert, oriented, no adenopathy, well-dressed, normal affect, normal respiratory effort. Examination of the 6 skin graft shows good integration she has good healthy granulation tissue the remainder of the sutures and staples were removed.  There is no ascending cellulitis no drainage no odor no signs of infection.  Patient has pain along the medial column and this is where she sustained the Lisfranc fracture dislocation.  Imaging: No results found.   Labs: No results found for: HGBA1C, ESRSEDRATE, CRP, LABURIC, REPTSTATUS, GRAMSTAIN,  CULT, LABORGA   Lab Results  Component Value Date   ALBUMIN 4.1 07/24/2018    Body mass index is 29.1 kg/m.  Orders:  No orders of the defined types were placed in this encounter.  No orders of the defined types were placed in this encounter.    Procedures: No procedures performed  Clinical Data: No additional findings.  ROS:  All other systems negative, except as noted in the HPI. Review of Systems  Objective: Vital Signs: Ht 5' (1.524 m)   Wt 149 lb (67.6 kg)   BMI 29.10 kg/m   Specialty Comments:  No specialty comments available.  PMFS History: Patient Active Problem List   Diagnosis Date Noted  . Lisfranc dislocation, left, sequela   . Gangrene of left foot (HCC)   . Crush injury of left foot 07/24/2018  . 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 Displaced fracture of first metatarsal bone, left foot, initial encounter for open fracture 07/24/2018  . Displaced fracture of second metatarsal bone, left foot, initial encounter for open fracture 07/24/2018  . Displaced fracture of third metatarsal bone, left foot, initial encounter for open fracture 07/24/2018   Past Medical History:  Diagnosis Date  . Medical history non-contributory     History reviewed. No pertinent family history.  Past Surgical History:  Procedure Laterality Date  .  AMPUTATION TOE Left 07/24/2018   Procedure: AMPUTATION TOE;  Surgeon: Roby LoftsHaddix, Kevin P, MD;  Location: [facility];  Service: Orthopedics;  Laterality: Left;  . CHOLECYSTECTOMY    . I&D EXTREMITY Left 07/24/2018   Procedure: IRRIGATION AND DEBRIDEMENT EXTREMITY;  Surgeon: Roby LoftsHaddix, Kevin P, MD;  Location: [facility] OR;  Service: Orthopedics;  Laterality: Left;  . I&D EXTREMITY Left 08/02/2018   Procedure: DEBRIDEMENT LEFT FOOT, PLACE VAC;  Surgeon: Nadara Mustarduda, Jontavia Leatherbury V, MD;  Location: [facility];  Service: Orthopedics;  Laterality: Left;  . ORIF ANKLE FRACTURE Left 08/04/2018   Procedure: OPEN  REDUCTION INTERNAL FIXATION (ORIF) LISFRANC FRACTURE DISLOCATION LEFT FOOT;  Surgeon: Nadara Mustarduda, Sayge Brienza V, MD;  Location: [facility];  Service: Orthopedics;  Laterality: Left;  . ORIF TOE FRACTURE Left 07/24/2018   Procedure: OPEN REDUCTION INTERNAL FIXATION (ORIF) METATARSAL (TOE) FRACTURE;  Surgeon: Roby LoftsHaddix, Kevin P, MD;  Location: [facility];  Service: Orthopedics;  Laterality: Left;  . SKIN SPLIT GRAFT Left 08/04/2018   Procedure: SKIN GRAFT SPLIT THICKNESS LEFT FOOT;  Surgeon: Nadara Mustarduda, Maie Kesinger V, MD;  [facility];  Service: Orthopedics;  Laterality: Left;   Social History   Occupational History  . Not on file  Tobacco Use  . Smoking status: Never Smoker  . Smokeless tobacco: Never Used  Substance and Sexual Activity  . Alcohol use: Never    Frequency: Never  . Drug use: Never  . Sexual activity: Not on file

## 2018-09-04 ENCOUNTER — Telehealth (INDEPENDENT_AMBULATORY_CARE_PROVIDER_SITE_OTHER): Payer: Self-pay

## 2018-09-04 NOTE — Telephone Encounter (Signed)
LVM to return call, for COVID-19 questions and also to get rescheduled to earlier time for 09/05/2018 or to make another appt.

## 2018-09-05 ENCOUNTER — Ambulatory Visit (INDEPENDENT_AMBULATORY_CARE_PROVIDER_SITE_OTHER): Payer: Self-pay | Admitting: Orthopedic Surgery

## 2018-09-07 ENCOUNTER — Other Ambulatory Visit: Payer: Self-pay

## 2018-09-07 ENCOUNTER — Ambulatory Visit (INDEPENDENT_AMBULATORY_CARE_PROVIDER_SITE_OTHER): Payer: Worker's Compensation | Admitting: Orthopedic Surgery

## 2018-09-07 ENCOUNTER — Encounter (INDEPENDENT_AMBULATORY_CARE_PROVIDER_SITE_OTHER): Payer: Self-pay | Admitting: Orthopedic Surgery

## 2018-09-07 ENCOUNTER — Ambulatory Visit (INDEPENDENT_AMBULATORY_CARE_PROVIDER_SITE_OTHER): Payer: Self-pay | Admitting: Physician Assistant

## 2018-09-07 VITALS — Ht 60.0 in | Wt 149.0 lb

## 2018-09-07 DIAGNOSIS — S9782XS Crushing injury of left foot, sequela: Secondary | ICD-10-CM

## 2018-09-07 DIAGNOSIS — S93325S Dislocation of tarsometatarsal joint of left foot, sequela: Secondary | ICD-10-CM

## 2018-09-07 MED ORDER — GABAPENTIN 300 MG PO CAPS: 300.0000 mg | ORAL_CAPSULE | Freq: Three times a day (TID) | ORAL | 3 refills | Status: AC

## 2018-09-07 MED ORDER — OXYCODONE-ACETAMINOPHEN 10-325 MG PO TABS: 1.0000 | ORAL_TABLET | Freq: Three times a day (TID) | ORAL | 0 refills | Status: DC | PRN

## 2018-09-07 NOTE — Progress Notes (Signed)
I spoke to Mrs. Katelyn Chapman through [address] [place] [place] #[id number]. Patient denies chest pain or shortness of breath. Patient denies that she or her family has experienced any of the following: Cough Fever >100.4 Runny Nose Sore Throat Difficulty breathing/ shortness of breath Travel in past 14 days- no.

## 2018-09-07 NOTE — Progress Notes (Signed)
Office Visit Note   Patient: Katelyn Chapman           Date of Birth: 08/28/1965           MRN: [id number] Visit Date: 09/07/2018              Requested by: Oswaldo ConroyBender, Abby Daneele, MD [address] [place], [place] [id number] PCP: Oswaldo ConroyBender, Abby Daneele, MD  Chief Complaint  Patient presents with  . Left Foot - Routine Post Op    08/04/2018 ORIF lisfranc fx  Work comp      HPI: Patient is a 53 year old woman status post crush injury to the left foot, limb salvage intervention with internal fixation for the Lisfranc fracture as well as skin graft.  Patient presents at this time with granulation tissue, no epithelization,  she states that the Percocet 5 mg is not sufficient and she also states that she is not getting enough relief with the Neurontin.  Assessment & Plan: Visit Diagnoses:  1. Crushing injury of left foot, sequela   2. Lisfranc dislocation, left, sequela     Plan: Prescription called in for 10 mg oxycodone  that she can take twice a day and Neurontin 300 mg 3 times a day.  We will plan for collagen skin graft tomorrow with Integra fetal bovine collagen with silver product.  Outpatient surgery tomorrow with a wound VAC.  Follow-Up Instructions: Return in about 1 week (around 09/14/2018).   Ortho Exam  Patient is alert, oriented, no adenopathy, well-dressed, normal affect, normal respiratory effort. Examination patient's wound healing has stalled she does have 100% beefy granulation tissue and has a large wound that is approximately 6 x 10 cm and will need additional collagen grafting to facilitate healing.  There is no ascending cellulitis no signs of infection.  Imaging: No results found.   Labs: No results found for: HGBA1C, ESRSEDRATE, CRP, LABURIC, REPTSTATUS, GRAMSTAIN, CULT, LABORGA   Lab Results  Component Value Date   ALBUMIN 4.1 07/24/2018    Body mass index is 29.1 kg/m.  Orders:  No orders of the defined types were placed in this  encounter.  No orders of the defined types were placed in this encounter.    Procedures: No procedures performed  Clinical Data: No additional findings.  ROS:  All other systems negative, except as noted in the HPI. Review of Systems  Objective: Vital Signs: Ht 5' (1.524 m)   Wt 149 lb (67.6 kg)   BMI 29.10 kg/m   Specialty Comments:  No specialty comments available.  PMFS History: Patient Active Problem List   Diagnosis Date Noted  . Lisfranc dislocation, left, sequela   . Gangrene of left foot (HCC)   . Crush injury of left foot 07/24/2018  . 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 Displaced fracture of first metatarsal bone, left foot, initial encounter for open fracture 07/24/2018  . Displaced fracture of second metatarsal bone, left foot, initial encounter for open fracture 07/24/2018  . Displaced fracture of third metatarsal bone, left foot, initial encounter for open fracture 07/24/2018   Past Medical History:  Diagnosis Date  . Medical history non-contributory     History reviewed. No pertinent family history.  Past Surgical History:  Procedure Laterality Date  . AMPUTATION TOE Left 07/24/2018   Procedure: AMPUTATION TOE;  Surgeon: Roby LoftsHaddix, Kevin P, MD;  [facility]: [facility];  Service: Orthopedics;  Laterality: Left;  . CHOLECYSTECTOMY    .  I&D EXTREMITY Left 07/24/2018   Procedure: IRRIGATION AND DEBRIDEMENT EXTREMITY;  Surgeon: Roby Lofts, MD;  [facility]: [facility];  Service: Orthopedics;  Laterality: Left;  . I&D EXTREMITY Left 08/02/2018   Procedure: DEBRIDEMENT LEFT FOOT, PLACE VAC;  Surgeon: Nadara Mustard, MD;  [facility];  Service: Orthopedics;  Laterality: Left;  . ORIF ANKLE FRACTURE Left 08/04/2018   Procedure: OPEN REDUCTION INTERNAL FIXATION (ORIF) LISFRANC FRACTURE DISLOCATION LEFT FOOT;  Surgeon: Nadara Mustard, MD;  [facility];  Service: Orthopedics;  Laterality: Left;  . ORIF TOE  FRACTURE Left 07/24/2018   Procedure: OPEN REDUCTION INTERNAL FIXATION (ORIF) METATARSAL (TOE) FRACTURE;  Surgeon: Roby Lofts, MD;  [facility];  Service: Orthopedics;  Laterality: Left;  . SKIN SPLIT GRAFT Left 08/04/2018   Procedure: SKIN GRAFT SPLIT THICKNESS LEFT FOOT;  Surgeon: Nadara Mustard, MD;  [facility] OR;  Service: Orthopedics;  Laterality: Left;   Social History   Occupational History  . Not on file  Tobacco Use  . Smoking status: Never Smoker  . Smokeless tobacco: Never Used  Substance and Sexual Activity  . Alcohol use: Never    Frequency: Never  . Drug use: Never  . Sexual activity: Not on file

## 2018-09-08 ENCOUNTER — Ambulatory Visit (HOSPITAL_COMMUNITY)
Admission: RE | Admit: 2018-09-08 | Discharge: 2018-09-08 | Disposition: A | Discharge status: Home / Self Care | Payer: Worker's Compensation | Attending: Orthopedic Surgery | Admitting: Orthopedic Surgery

## 2018-09-08 ENCOUNTER — Encounter (HOSPITAL_COMMUNITY)
Admission: RE | Disposition: A | Discharge status: Home / Self Care | Payer: Self-pay | Source: Home / Self Care | Attending: Orthopedic Surgery

## 2018-09-08 ENCOUNTER — Ambulatory Visit (HOSPITAL_COMMUNITY): Payer: Worker's Compensation | Admitting: Anesthesiology

## 2018-09-08 ENCOUNTER — Encounter (HOSPITAL_COMMUNITY): Payer: Self-pay | Admitting: *Deleted

## 2018-09-08 ENCOUNTER — Other Ambulatory Visit: Payer: Self-pay

## 2018-09-08 DIAGNOSIS — Z89422 Acquired absence of other left toe(s): Secondary | ICD-10-CM | POA: Diagnosis not present

## 2018-09-08 DIAGNOSIS — S91302S Unspecified open wound, left foot, sequela: Secondary | ICD-10-CM | POA: Diagnosis not present

## 2018-09-08 DIAGNOSIS — S91302A Unspecified open wound, left foot, initial encounter: Secondary | ICD-10-CM | POA: Insufficient documentation

## 2018-09-08 DIAGNOSIS — W230XXA Caught, crushed, jammed, or pinched between moving objects, initial encounter: Secondary | ICD-10-CM | POA: Diagnosis not present

## 2018-09-08 DIAGNOSIS — S93325S Dislocation of tarsometatarsal joint of left foot, sequela: Secondary | ICD-10-CM

## 2018-09-08 HISTORY — PX: SKIN SPLIT GRAFT: SHX444

## 2018-09-08 LAB — CBC
HCT: 36.2 % (ref 36.0–46.0)
Hemoglobin: 10.9 g/dL — ABNORMAL LOW (ref 12.0–15.0)
MCH: 25.4 pg — ABNORMAL LOW (ref 26.0–34.0)
MCHC: 30.1 g/dL (ref 30.0–36.0)
MCV: 84.4 fL (ref 80.0–100.0)
Platelets: 334 10*3/uL (ref 150–400)
RBC: 4.29 MIL/uL (ref 3.87–5.11)
RDW: 14 % (ref 11.5–15.5)
WBC: 6.2 10*3/uL (ref 4.0–10.5)
nRBC: 0 % (ref 0.0–0.2)

## 2018-09-08 LAB — POCT PREGNANCY, URINE: Preg Test, Ur: NEGATIVE

## 2018-09-08 SURGERY — APPLICATION, GRAFT, SKIN, SPLIT-THICKNESS
Anesthesia: General | Site: Foot | Laterality: Left

## 2018-09-08 MED ORDER — PROPOFOL 10 MG/ML IV BOLUS
INTRAVENOUS | Status: DC | PRN
  Administered 2018-09-08: 120 mg via INTRAVENOUS
  Administered 2018-09-08: 20 mg via INTRAVENOUS

## 2018-09-08 MED ORDER — SUCCINYLCHOLINE CHLORIDE 200 MG/10ML IV SOSY
PREFILLED_SYRINGE | INTRAVENOUS | Status: DC | PRN
  Administered 2018-09-08: 100 mg via INTRAVENOUS

## 2018-09-08 MED ORDER — ACETAMINOPHEN 325 MG PO TABS: 325.0000 mg | ORAL_TABLET | ORAL | Status: DC | PRN

## 2018-09-08 MED ORDER — CEFAZOLIN SODIUM-DEXTROSE 2-4 GM/100ML-% IV SOLN
2.0000 g | INTRAVENOUS | Status: AC
  Administered 2018-09-08: 13:00:00 2 g via INTRAVENOUS
  Filled 2018-09-08: qty 100

## 2018-09-08 MED ORDER — OXYCODONE HCL 5 MG PO TABS
ORAL_TABLET | ORAL | Status: AC
  Filled 2018-09-08: qty 1

## 2018-09-08 MED ORDER — PROPOFOL 10 MG/ML IV BOLUS
INTRAVENOUS | Status: AC
  Filled 2018-09-08: qty 20

## 2018-09-08 MED ORDER — ACETAMINOPHEN 160 MG/5ML PO SOLN: 325.0000 mg | ORAL | Status: DC | PRN

## 2018-09-08 MED ORDER — FENTANYL CITRATE (PF) 100 MCG/2ML IJ SOLN
INTRAMUSCULAR | Status: AC
  Filled 2018-09-08: qty 2

## 2018-09-08 MED ORDER — MIDAZOLAM HCL 2 MG/2ML IJ SOLN
INTRAMUSCULAR | Status: AC
  Filled 2018-09-08: qty 2

## 2018-09-08 MED ORDER — CHLORHEXIDINE GLUCONATE 4 % EX LIQD: 60.0000 mL | Freq: Once | CUTANEOUS | Status: DC

## 2018-09-08 MED ORDER — DEXAMETHASONE SODIUM PHOSPHATE 10 MG/ML IJ SOLN
INTRAMUSCULAR | Status: DC | PRN
  Administered 2018-09-08: 5 mg via INTRAVENOUS

## 2018-09-08 MED ORDER — MEPERIDINE HCL 50 MG/ML IJ SOLN: 6.2500 mg | INTRAMUSCULAR | Status: DC | PRN

## 2018-09-08 MED ORDER — LACTATED RINGERS IV SOLN
INTRAVENOUS | Status: DC
  Administered 2018-09-08: 10:00:00 via INTRAVENOUS

## 2018-09-08 MED ORDER — FENTANYL CITRATE (PF) 250 MCG/5ML IJ SOLN
INTRAMUSCULAR | Status: AC
  Filled 2018-09-08: qty 5

## 2018-09-08 MED ORDER — FENTANYL CITRATE (PF) 100 MCG/2ML IJ SOLN
25.0000 ug | INTRAMUSCULAR | Status: DC | PRN
  Administered 2018-09-08 (×2): 25 ug via INTRAVENOUS

## 2018-09-08 MED ORDER — DEXAMETHASONE SODIUM PHOSPHATE 10 MG/ML IJ SOLN
INTRAMUSCULAR | Status: AC
  Filled 2018-09-08: qty 1

## 2018-09-08 MED ORDER — MIDAZOLAM HCL 5 MG/5ML IJ SOLN
INTRAMUSCULAR | Status: DC | PRN
  Administered 2018-09-08: 2 mg via INTRAVENOUS

## 2018-09-08 MED ORDER — SUCCINYLCHOLINE CHLORIDE 200 MG/10ML IV SOSY
PREFILLED_SYRINGE | INTRAVENOUS | Status: AC
  Filled 2018-09-08: qty 10

## 2018-09-08 MED ORDER — ONDANSETRON HCL 4 MG/2ML IJ SOLN: 4.0000 mg | Freq: Once | INTRAMUSCULAR | Status: DC | PRN

## 2018-09-08 MED ORDER — LIDOCAINE 2% (20 MG/ML) 5 ML SYRINGE
INTRAMUSCULAR | Status: AC
  Filled 2018-09-08: qty 5

## 2018-09-08 MED ORDER — LIDOCAINE 2% (20 MG/ML) 5 ML SYRINGE
INTRAMUSCULAR | Status: DC | PRN
  Administered 2018-09-08: 60 mg via INTRAVENOUS

## 2018-09-08 MED ORDER — FENTANYL CITRATE (PF) 100 MCG/2ML IJ SOLN
INTRAMUSCULAR | Status: DC | PRN
  Administered 2018-09-08 (×2): 50 ug via INTRAVENOUS

## 2018-09-08 MED ORDER — OXYCODONE HCL 5 MG/5ML PO SOLN: 5.0000 mg | Freq: Once | ORAL | Status: AC | PRN

## 2018-09-08 MED ORDER — 0.9 % SODIUM CHLORIDE (POUR BTL) OPTIME
TOPICAL | Status: DC | PRN
  Administered 2018-09-08: 1000 mL

## 2018-09-08 MED ORDER — OXYCODONE HCL 5 MG PO TABS
5.0000 mg | ORAL_TABLET | Freq: Once | ORAL | Status: AC | PRN
  Administered 2018-09-08: 14:00:00 5 mg via ORAL

## 2018-09-08 MED ORDER — ONDANSETRON HCL 4 MG/2ML IJ SOLN
INTRAMUSCULAR | Status: DC | PRN
  Administered 2018-09-08: 4 mg via INTRAVENOUS

## 2018-09-08 SURGICAL SUPPLY — 50 items
BNDG COHESIVE 4X5 TAN STRL (GAUZE/BANDAGES/DRESSINGS) ×2 IMPLANT
BNDG COHESIVE 6X5 TAN STRL LF (GAUZE/BANDAGES/DRESSINGS) IMPLANT
BNDG ESMARK 4X9 LF (GAUZE/BANDAGES/DRESSINGS) IMPLANT
BNDG GAUZE STRTCH 6 (GAUZE/BANDAGES/DRESSINGS) IMPLANT
COVER SURGICAL LIGHT HANDLE (MISCELLANEOUS) ×2 IMPLANT
COVER WAND RF STERILE (DRAPES) IMPLANT
CUFF TOURNIQUET SINGLE 18IN (TOURNIQUET CUFF) IMPLANT
CUFF TOURNIQUET SINGLE 24IN (TOURNIQUET CUFF) IMPLANT
DERMACARRIERS GRAFT 1 TO 1.5 (DISPOSABLE)
DRAPE INCISE IOBAN 66X45 STRL (DRAPES) ×2 IMPLANT
DRAPE U-SHAPE 47X51 STRL (DRAPES) ×2 IMPLANT
DRESSING VERAFLO CLEANSE CC (GAUZE/BANDAGES/DRESSINGS) ×1 IMPLANT
DRSG ADAPTIC 3X8 NADH LF (GAUZE/BANDAGES/DRESSINGS) IMPLANT
DRSG MEPITEL 4X7.2 (GAUZE/BANDAGES/DRESSINGS) IMPLANT
DRSG VERAFLO CLEANSE CC (GAUZE/BANDAGES/DRESSINGS) ×2
DURAPREP 26ML APPLICATOR (WOUND CARE) IMPLANT
ELECT REM PT RETURN 9FT ADLT (ELECTROSURGICAL) ×2
ELECTRODE REM PT RTRN 9FT ADLT (ELECTROSURGICAL) ×1 IMPLANT
GAUZE SPONGE 4X4 12PLY STRL (GAUZE/BANDAGES/DRESSINGS) IMPLANT
GLOVE BIOGEL PI IND STRL 7.0 (GLOVE) ×1 IMPLANT
GLOVE BIOGEL PI IND STRL 9 (GLOVE) ×1 IMPLANT
GLOVE BIOGEL PI INDICATOR 7.0 (GLOVE) ×1
GLOVE BIOGEL PI INDICATOR 9 (GLOVE) ×1
GLOVE SURG ORTHO 9.0 STRL STRW (GLOVE) ×2 IMPLANT
GLOVE SURG SS PI 7.0 STRL IVOR (GLOVE) ×4 IMPLANT
GOWN STRL REUS W/ TWL XL LVL3 (GOWN DISPOSABLE) ×2 IMPLANT
GOWN STRL REUS W/TWL XL LVL3 (GOWN DISPOSABLE) ×2
GRAFT DERMACARRIERS 1 TO 1.5 (DISPOSABLE) IMPLANT
GRAFT MESHED PRIMATRIX 8X12 (Graft) ×2 IMPLANT
KIT BASIN OR (CUSTOM PROCEDURE TRAY) ×2 IMPLANT
KIT DRSG PREVENA PLUS 7DAY 125 (MISCELLANEOUS) ×2 IMPLANT
KIT TURNOVER KIT B (KITS) ×2 IMPLANT
MANIFOLD NEPTUNE II (INSTRUMENTS) IMPLANT
NEEDLE HYPO 25GX1X1/2 BEV (NEEDLE) IMPLANT
NS IRRIG 1000ML POUR BTL (IV SOLUTION) ×2 IMPLANT
PACK ORTHO EXTREMITY (CUSTOM PROCEDURE TRAY) ×2 IMPLANT
PAD ARMBOARD 7.5X6 YLW CONV (MISCELLANEOUS) ×2 IMPLANT
PAD CAST 4YDX4 CTTN HI CHSV (CAST SUPPLIES) IMPLANT
PAD NEG PRESSURE SENSATRAC (MISCELLANEOUS) ×2 IMPLANT
PADDING CAST COTTON 4X4 STRL (CAST SUPPLIES)
SOL PREP POV-IOD 4OZ 10% (MISCELLANEOUS) ×2 IMPLANT
STAPLER VISISTAT 35W (STAPLE) ×2 IMPLANT
SUCTION FRAZIER HANDLE 10FR (MISCELLANEOUS) ×1
SUCTION TUBE FRAZIER 10FR DISP (MISCELLANEOUS) ×1 IMPLANT
SUT ETHILON 4 0 PS 2 18 (SUTURE) IMPLANT
SYR CONTROL 10ML LL (SYRINGE) IMPLANT
TOWEL OR 17X24 6PK STRL BLUE (TOWEL DISPOSABLE) IMPLANT
TOWEL OR 17X26 10 PK STRL BLUE (TOWEL DISPOSABLE) ×2 IMPLANT
TUBE CONNECTING 12X1/4 (SUCTIONS) ×2 IMPLANT
WATER STERILE IRR 1000ML POUR (IV SOLUTION) IMPLANT

## 2018-09-08 NOTE — Transfer of Care (Signed)
Immediate Anesthesia Transfer of Care Note  Patient: Katelyn Chapman  Procedure(s) Performed: SPLIT THICKNESS SKIN GRAFT LEFT FOOT (Left Foot)  Patient Location: PACU  Anesthesia Type:General  Level of Consciousness: drowsy and patient cooperative  Airway & Oxygen Therapy: Patient Spontanous Breathing and Patient connected to nasal cannula oxygen  Post-op Assessment: Report given to RN, Post -op Vital signs reviewed and stable and Patient moving all extremities  Post vital signs: Reviewed and stable  Last Vitals:  Vitals Value Taken Time  BP 127/69 09/08/2018  1:14 PM  Temp    Pulse 91 09/08/2018  1:15 PM  Resp 18 09/08/2018  1:15 PM  SpO2 100 % 09/08/2018  1:15 PM  Vitals shown include unvalidated device data.  Last Pain:  Vitals:   09/08/18 1032  TempSrc:   PainSc: 0-No pain      Patients Stated Pain Goal: 4 (09/08/18 1032)  Complications: No apparent anesthesia complications

## 2018-09-08 NOTE — H&P (Signed)
Katelyn Chapman is an 53 y.o. female.   Chief Complaint: Crush injury to left foot with residual open wound HPI: The patient is a 53 year old woman who had a crush injury to her left foot and underwent ORIF of her Lisfranc fracture/dislocation as well as limb salvage intervention with a transmetatarsal amputation and split thickness skin graft to the residual wound.  She has an open wound residually and is admitted for further split thickness skin graft to the open area with VAC placement in the OR today.  Past Medical History:  Diagnosis Date  . Medical history non-contributory     Past Surgical History:  Procedure Laterality Date  . AMPUTATION TOE Left 07/24/2018   Procedure: AMPUTATION TOE;  Surgeon: Roby Lofts, MD;  [facility];  Service: Orthopedics;  Laterality: Left;  . CHOLECYSTECTOMY    . I&D EXTREMITY Left 07/24/2018   Procedure: IRRIGATION AND DEBRIDEMENT EXTREMITY;  Surgeon: Roby Lofts, MD;  [facility];  Service: Orthopedics;  Laterality: Left;  . I&D EXTREMITY Left 08/02/2018   Procedure: DEBRIDEMENT LEFT FOOT, PLACE VAC;  Surgeon: Nadara Mustard, MD;  [facility];  Service: Orthopedics;  Laterality: Left;  . ORIF ANKLE FRACTURE Left 08/04/2018   Procedure: OPEN REDUCTION INTERNAL FIXATION (ORIF) LISFRANC FRACTURE DISLOCATION LEFT FOOT;  Surgeon: Nadara Mustard, MD;  [facility];  Service: Orthopedics;  Laterality: Left;  . ORIF TOE FRACTURE Left 07/24/2018   Procedure: OPEN REDUCTION INTERNAL FIXATION (ORIF) METATARSAL (TOE) FRACTURE;  Surgeon: Roby Lofts, MD;  [facility];  Service: Orthopedics;  Laterality: Left;  . SKIN SPLIT GRAFT Left 08/04/2018   Procedure: SKIN GRAFT SPLIT THICKNESS LEFT FOOT;  Surgeon: Nadara Mustard, MD;  Location: [facility] OR;  Service: Orthopedics;  Laterality: Left;    No family history on file. Social History:  reports that she has never smoked. She has never used smokeless tobacco. She reports that she does not drink  alcohol or use drugs.  Allergies: No Known Allergies  No medications prior to admission.    No results found for this or any previous visit (from the past 48 hour(s)). No results found.  Review of Systems  All other systems reviewed and are negative.   Last menstrual period 08/04/2018. Physical Exam  Constitutional: She is oriented to person, place, and time. She appears well-developed and well-nourished. No distress.  HENT:  Head: Normocephalic and atraumatic.  Neck: No tracheal deviation present. No thyromegaly present.  Cardiovascular: Normal rate.  Respiratory: Effort normal. No stridor. No respiratory distress.  GI: Soft. She exhibits no distension.  Musculoskeletal:     Comments: Examination patient's wound healing has stalled she does have 100% beefy granulation tissue and has a large wound that is approximately 6 x 10 cm and will need additional collagen grafting to facilitate healing.  There is no ascending cellulitis no signs of infection.  Neurological: She is alert and oriented to person, place, and time. No cranial nerve deficit. Coordination normal.  Skin: Skin is warm.  Psychiatric: She has a normal mood and affect. Her behavior is normal. Judgment and thought content normal.       Assessment/Plan Residual wound left foot following severe crush injury-plan further split thickness skin graft for wound coverage with VAC placement.  The procedure, possible risks and benefits including the risk of bleeding, infection, neurovascular injury, and possible need for further surgery were discussed with the patient and the patient desires to proceed with surgery at this time.  Her questions were answered to her satisfaction with the assistance of Spanish interpreter.  Lazaro ArmsSHAWN RAYBURN, PA-C 09/08/2018, 7:09 AM [facility] [phone]

## 2018-09-08 NOTE — Anesthesia Procedure Notes (Signed)
Procedure Name: Intubation Date/Time: 09/08/2018 12:48 PM Performed by: Janeece Riggers, MD Pre-anesthesia Checklist: Patient identified, Emergency Drugs available, Suction available and Patient being monitored Patient Re-evaluated:Patient Re-evaluated prior to induction Oxygen Delivery Method: Circle System Utilized Preoxygenation: Pre-oxygenation with 100% oxygen Induction Type: IV induction and Rapid sequence Laryngoscope Size: Mac and 3 Grade View: Grade I Tube type: Oral Tube size: 7.5 mm Number of attempts: 1 Airway Equipment and Method: Stylet and Oral airway Placement Confirmation: ETT inserted through vocal cords under direct vision,  positive ETCO2 and breath sounds checked- equal and bilateral Secured at: 22 cm Tube secured with: Tape Dental Injury: Teeth and Oropharynx as per pre-operative assessment

## 2018-09-08 NOTE — Anesthesia Preprocedure Evaluation (Signed)
Anesthesia Evaluation  Patient identified by MRN, date of birth, ID band Patient awake    Reviewed: Allergy & Precautions, H&P , NPO status , Patient's Chart, lab work & pertinent test results  Airway Mallampati: III  TM Distance: >3 FB Neck ROM: Full    Dental no notable dental hx. (+) Partial Lower, Partial Upper, Dental Advisory Given   Pulmonary neg pulmonary ROS,    Pulmonary exam normal breath sounds clear to auscultation       Cardiovascular negative cardio ROS   Rhythm:Regular Rate:Normal     Neuro/Psych negative neurological ROS  negative psych ROS   GI/Hepatic negative GI ROS, Neg liver ROS,   Endo/Other  negative endocrine ROS  Renal/GU negative Renal ROS  negative genitourinary   Musculoskeletal   Abdominal   Peds  Hematology negative hematology ROS (+)   Anesthesia Other Findings   Reproductive/Obstetrics negative OB ROS                             Anesthesia Physical  Anesthesia Plan  ASA: II and emergent  Anesthesia Plan: General   Post-op Pain Management:    Induction: Intravenous  PONV Risk Score and Plan: 3 and Ondansetron, Dexamethasone and Midazolam  Airway Management Planned: Oral ETT and LMA  Additional Equipment:   Intra-op Plan:   Post-operative Plan: Extubation in OR  Informed Consent: I have reviewed the patients History and Physical, chart, labs and discussed the procedure including the risks, benefits and alternatives for the proposed anesthesia with the patient or authorized representative who has indicated his/her understanding and acceptance.     Dental advisory given  Plan Discussed with: CRNA, Anesthesiologist and Surgeon  Anesthesia Plan Comments:         Anesthesia Quick Evaluation

## 2018-09-08 NOTE — Op Note (Signed)
09/08/2018  1:27 PM  PATIENT:  Katelyn Chapman    PRE-OPERATIVE DIAGNOSIS:  Open Wound Left Foot  POST-OPERATIVE DIAGNOSIS:  Same  PROCEDURE:  SPLIT THICKNESS SKIN GRAFT LEFT FOOT Wound bed preparation for skin graft. 8 x10 cm of the Integra fetal bovine graft, Application of cleanse choice dressing.   SURGEON:  Nadara Mustard, MD  PHYSICIAN ASSISTANT:None ANESTHESIA:   General  PREOPERATIVE INDICATIONS:  [name] is a  53 y.o. female with a diagnosis of Open Wound Left Foot who failed conservative measures and elected for surgical management.    The risks benefits and alternatives were discussed with the patient preoperatively including but not limited to the risks of infection, bleeding, nerve injury, cardiopulmonary complications, the need for revision surgery, among others, and the patient was willing to proceed.  OPERATIVE IMPLANTS: Integra fetal bovine graft 8 x 10 cm  @ENCIMAGES @  OPERATIVE FINDINGS: Healthy granulation tissue.  OPERATIVE PROCEDURE: Patient was brought the operating room and underwent a general anesthetic.  After adequate levels anesthesia were obtained patient's left lower extremity was prepped using Betadine paint draped into a sterile field a timeout was called.  Patient underwent sharp debridement of the foot wound which was 8 x 10 cm.  A rondure knife were used for debridement around the wound edges.  Patient had 100% healthy beefy granulation tissue the wound was irrigated with normal saline.  The skin graft of the Integra fetal bovine graft was applied 8 x 10 cm.  This was secured with staples.  The reticulated cleanse choice foam followed by the solid foam was applied to the wound this had a good suction fit patient was extubated taken the PACU in stable condition.   DISCHARGE PLANNING:  Antibiotic duration: Preoperative antibiotics  Weightbearing: Nonweightbearing on the left  Pain medication: Patient has a prescription at  home  Dressing care/ Wound VAC: Continue wound VAC for 1 week  Ambulatory devices: Walker and wheelchair  Discharge to: Discharge to home from recovery room  Follow-up: In the office 1 week post operative.

## 2018-09-11 ENCOUNTER — Encounter (HOSPITAL_COMMUNITY): Payer: Self-pay | Admitting: Orthopedic Surgery

## 2018-09-12 NOTE — Anesthesia Postprocedure Evaluation (Signed)
Anesthesia Post Note  Patient: Katelyn Chapman  Procedure(s) Performed: SPLIT THICKNESS SKIN GRAFT LEFT FOOT (Left Foot)     Patient location during evaluation: PACU Anesthesia Type: General Level of consciousness: awake and alert Pain management: pain level controlled Vital Signs Assessment: post-procedure vital signs reviewed and stable Respiratory status: spontaneous breathing, nonlabored ventilation, respiratory function stable and patient connected to nasal cannula oxygen Cardiovascular status: blood pressure returned to baseline and stable Postop Assessment: no apparent nausea or vomiting Anesthetic complications: no    Last Vitals:  Vitals:   09/08/18 1400 09/08/18 1415  BP: 118/88 117/76  Pulse: 69 71  Resp: 15 16  Temp:  (!) 36.4 C  SpO2: 96% 96%    Last Pain:  Vitals:   09/08/18 1415  TempSrc:   PainSc: 3                  Latayna Ritchie

## 2018-09-14 ENCOUNTER — Encounter (INDEPENDENT_AMBULATORY_CARE_PROVIDER_SITE_OTHER): Payer: Self-pay | Admitting: Orthopedic Surgery

## 2018-09-14 ENCOUNTER — Other Ambulatory Visit: Payer: Self-pay

## 2018-09-14 ENCOUNTER — Ambulatory Visit (INDEPENDENT_AMBULATORY_CARE_PROVIDER_SITE_OTHER): Payer: Worker's Compensation | Admitting: Orthopedic Surgery

## 2018-09-14 VITALS — Ht 60.0 in | Wt 149.0 lb

## 2018-09-14 DIAGNOSIS — S93325S Dislocation of tarsometatarsal joint of left foot, sequela: Secondary | ICD-10-CM

## 2018-09-14 DIAGNOSIS — S9782XS Crushing injury of left foot, sequela: Secondary | ICD-10-CM

## 2018-09-14 MED ORDER — OXYCODONE-ACETAMINOPHEN 5-325 MG PO TABS: 1.0000 | ORAL_TABLET | Freq: Three times a day (TID) | ORAL | 0 refills | Status: DC | PRN

## 2018-09-14 NOTE — Addendum Note (Signed)
Addended by: Aldean Baker on: 09/14/2018 03:51 PM   Modules accepted: Orders

## 2018-09-14 NOTE — Progress Notes (Signed)
Office Visit Note   Patient: Katelyn Chapman           Date of Birth: 03/14/1966           MRN: [id number] Visit Date: 09/14/2018              Requested by: Oswaldo ConroyBender, Abby Daneele, MD [address] [place], [place] [id number] PCP: Oswaldo ConroyBender, Abby Daneele, MD  Chief Complaint  Patient presents with  . Left Foot - Routine Post Op    09/05/2018 [facility] left foot      HPI: Patient is a 53 year old woman who presents in follow-up status post split-thickness skin graft left foot crush injury.  Assessment & Plan: Visit Diagnoses:  1. Crushing injury of left foot, sequela   2. Lisfranc dislocation, left, sequela     Plan: Orders are written for home health nursing to provide Silvadene dressing changes 3 times a week at home a thin amount of Silvadene is to be used too much was used before plus a dry dressing plus Ace wrap follow-up in 1 week.  Once we see some incorporation of the skin graft we will get her back into her medical compression stocking.  Follow-Up Instructions: Return in about 1 week (around 09/21/2018).   Ortho Exam  Patient is alert, oriented, no adenopathy, well-dressed, normal affect, normal respiratory effort. Examination patient has excellent granulation tissue with good incorporation of the graft.  There is hyper granulation tissue that has grown into the graft quite nicely there is no redness no cellulitis no signs of infection.  Imaging: No results found. No images are attached to the encounter.  Labs: No results found for: HGBA1C, ESRSEDRATE, CRP, LABURIC, REPTSTATUS, GRAMSTAIN, CULT, LABORGA   Lab Results  Component Value Date   ALBUMIN 4.1 07/24/2018    Body mass index is 29.1 kg/m.  Orders:  No orders of the defined types were placed in this encounter.  Meds ordered this encounter  Medications  . oxyCODONE-acetaminophen (PERCOCET/ROXICET) 5-325 MG tablet    Sig: Take 1 tablet by mouth every 8 (eight) hours as needed.    Dispense:   20 tablet    Refill:  0     Procedures: No procedures performed  Clinical Data: No additional findings.  ROS:  All other systems negative, except as noted in the HPI. Review of Systems  Objective: Vital Signs: Ht 5' (1.524 m)   Wt 149 lb (67.6 kg)   BMI 29.10 kg/m   Specialty Comments:  No specialty comments available.  PMFS History: Patient Active Problem List   Diagnosis Date Noted  . Open wound of foot except toes with complication, left, sequela   . Lisfranc dislocation, left, sequela   . Gangrene of left foot (HCC)   . Crush injury of left foot 07/24/2018  . 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 Displaced fracture of first metatarsal bone, left foot, initial encounter for open fracture 07/24/2018  . Displaced fracture of second metatarsal bone, left foot, initial encounter for open fracture 07/24/2018  . Displaced fracture of third metatarsal bone, left foot, initial encounter for open fracture 07/24/2018   Past Medical History:  Diagnosis Date  . Medical history non-contributory     History reviewed. No pertinent family history.  Past Surgical History:  Procedure Laterality Date  . AMPUTATION TOE Left 07/24/2018   Procedure: AMPUTATION TOE;  Surgeon: Roby LoftsHaddix, Kevin P, MD;  [facility]: [facility];  Service: Orthopedics;  Laterality: Left;  . CHOLECYSTECTOMY    . I&D EXTREMITY Left 07/24/2018   Procedure: IRRIGATION AND DEBRIDEMENT EXTREMITY;  Surgeon: Roby Lofts, MD;  [facility]: [facility];  Service: Orthopedics;  Laterality: Left;  . I&D EXTREMITY Left 08/02/2018   Procedure: DEBRIDEMENT LEFT FOOT, PLACE VAC;  Surgeon: Nadara Mustard, MD;  [facility]: [facility];  Service: Orthopedics;  Laterality: Left;  . ORIF ANKLE FRACTURE Left 08/04/2018   Procedure: OPEN REDUCTION INTERNAL FIXATION (ORIF) LISFRANC FRACTURE DISLOCATION LEFT FOOT;  Surgeon: Nadara Mustard, MD;  [facility];  Service: Orthopedics;  Laterality:  Left;  . ORIF TOE FRACTURE Left 07/24/2018   Procedure: OPEN REDUCTION INTERNAL FIXATION (ORIF) METATARSAL (TOE) FRACTURE;  Surgeon: Roby Lofts, MD;  [facility];  Service: Orthopedics;  Laterality: Left;  . SKIN SPLIT GRAFT Left 08/04/2018   Procedure: SKIN GRAFT SPLIT THICKNESS LEFT FOOT;  Surgeon: Nadara Mustard, MD;  Location[facility] OR;  Service: Orthopedics;  Laterality: Left;  . SKIN SPLIT GRAFT Left 09/08/2018   Procedure: SPLIT THICKNESS SKIN GRAFT LEFT FOOT;  Surgeon: Nadara Mustard, MD;  Location: [facility] OR;  Service: Orthopedics;  Laterality: Left;   Social History   Occupational History  . Not on file  Tobacco Use  . Smoking status: Never Smoker  . Smokeless tobacco: Never Used  Substance and Sexual Activity  . Alcohol use: Never    Frequency: Never  . Drug use: Never  . Sexual activity: Not on file

## 2018-09-21 ENCOUNTER — Other Ambulatory Visit: Payer: Self-pay

## 2018-09-21 ENCOUNTER — Encounter (INDEPENDENT_AMBULATORY_CARE_PROVIDER_SITE_OTHER): Payer: Self-pay | Admitting: Orthopedic Surgery

## 2018-09-21 ENCOUNTER — Ambulatory Visit (INDEPENDENT_AMBULATORY_CARE_PROVIDER_SITE_OTHER): Payer: Worker's Compensation | Admitting: Physician Assistant

## 2018-09-21 VITALS — Ht 60.0 in | Wt 149.0 lb

## 2018-09-21 DIAGNOSIS — S9782XS Crushing injury of left foot, sequela: Secondary | ICD-10-CM

## 2018-09-21 MED ORDER — DULOXETINE HCL 30 MG PO CPEP: 30.0000 mg | ORAL_CAPSULE | Freq: Every day | ORAL | 3 refills | Status: AC

## 2018-09-21 MED ORDER — OXYCODONE-ACETAMINOPHEN 5-325 MG PO TABS: 1.0000 | ORAL_TABLET | Freq: Four times a day (QID) | ORAL | 0 refills | Status: DC | PRN

## 2018-09-21 NOTE — Progress Notes (Addendum)
Office Visit Note   Patient: Katelyn Chapman           Date of Birth: April 03, 1966           MRN: [id number] Visit Date: 09/21/2018              Requested by: Oswaldo Conroy, MD [address] [place], [place] [address] PCP: Oswaldo Conroy, MD  Chief Complaint  Patient presents with  . Left Foot - Routine Post Op    09/08/2018 [facility] left foot workers comp      HPI: The patient is a  53 year old woman who is seen for post operative follow up following placement of Integra fetal bovine dermal graft on 09/08/2018. The graft has incorporated very well. She reports moderate continued pain and is not getting good relief with a combination of oxycodone/APAP and gabapentin 300 mg TID. We discussed starting some Cymbalta today and she is agreeable to try this as well. She is non weight bearing with a walker.  Home health following and using silvadene cream to the area 3 times weekly.  The patient's case manager and a Spanish interpretor were present for today's visit.   Assessment & Plan: Visit Diagnoses:  1. Crushing injury of left foot, sequela   2. S/P split thickness skin graft     Plan: Staples removed. Home health to continue silvadene dressing changes 3 times weekly.  May be able to go into medical compression sock next week.  Will start Cymbalta 30 mg daily and can increase if tolerates well next week.  She will need to continue out of work.  Follow up in 1 week.  Follow-Up Instructions: Return in about 1 week (around 09/28/2018).   Ortho Exam  Patient is alert, oriented, no adenopathy, well-dressed, normal affect, normal respiratory effort. The fetal bovine dermal graft appears well incorporated. The staples were removed today.  Good pedal pulses. No signs of infection or cellulitis. Peri wound without irritation.  Imaging: No results found.   Labs: No results found for: HGBA1C, ESRSEDRATE, CRP, LABURIC, REPTSTATUS, GRAMSTAIN, CULT, LABORGA   Lab  Results  Component Value Date   ALBUMIN 4.1 07/24/2018    Body mass index is 29.1 kg/m.  Orders:  No orders of the defined types were placed in this encounter.  Meds ordered this encounter  Medications  . DULoxetine (CYMBALTA) 30 MG capsule    Sig: Take 1 capsule (30 mg total) by mouth daily.    Dispense:  90 capsule    Refill:  3    Please add directions in Spanish for the patient  . oxyCODONE-acetaminophen (PERCOCET/ROXICET) 5-325 MG tablet    Sig: Take 1 tablet by mouth every 6 (six) hours as needed for moderate pain or severe pain.    Dispense:  30 tablet    Refill:  0    Please Add directions in Spanish     Procedures: No procedures performed  Clinical Data: No additional findings.  ROS:  All other systems negative, except as noted in the HPI. Review of Systems  Objective: Vital Signs: Ht 5' (1.524 m)   Wt 149 lb (67.6 kg)   BMI 29.10 kg/m   Specialty Comments:  No specialty comments available.  PMFS History: Patient Active Problem List   Diagnosis Date Noted  . Open wound of foot except toes with complication, left, sequela   . Lisfranc dislocation, left, sequela   . Gangrene of left foot (HCC)   .  Crush injury of left foot 07/24/2018  . 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 Displaced fracture of first metatarsal bone, left foot, initial encounter for open fracture 07/24/2018  . Displaced fracture of second metatarsal bone, left foot, initial encounter for open fracture 07/24/2018  . Displaced fracture of third metatarsal bone, left foot, initial encounter for open fracture 07/24/2018   Past Medical History:  Diagnosis Date  . Medical history non-contributory     History reviewed. No pertinent family history.  Past Surgical History:  Procedure Laterality Date  . AMPUTATION TOE Left 07/24/2018   Procedure: AMPUTATION TOE;  Surgeon: Roby LoftsHaddix, Kevin P, MD;  [facility];  Service:  Orthopedics;  Laterality: Left;  . CHOLECYSTECTOMY    . I&D EXTREMITY Left 07/24/2018   Procedure: IRRIGATION AND DEBRIDEMENT EXTREMITY;  Surgeon: Roby LoftsHaddix, Kevin P, MD;  [facility]: [facility];  Service: Orthopedics;  Laterality: Left;  . I&D EXTREMITY Left 08/02/2018   Procedure: DEBRIDEMENT LEFT FOOT, PLACE VAC;  Surgeon: Nadara Mustarduda, Marcus V, MD;  [facility]: [facility];  Service: Orthopedics;  Laterality: Left;  . ORIF ANKLE FRACTURE Left 08/04/2018   Procedure: OPEN REDUCTION INTERNAL FIXATION (ORIF) LISFRANC FRACTURE DISLOCATION LEFT FOOT;  Surgeon: Nadara Mustarduda, Marcus V, MD;  [facility];  Service: Orthopedics;  Laterality: Left;  . ORIF TOE FRACTURE Left 07/24/2018   Procedure: OPEN REDUCTION INTERNAL FIXATION (ORIF) METATARSAL (TOE) FRACTURE;  Surgeon: Roby LoftsHaddix, Kevin P, MD;  [facility];  Service: Orthopedics;  Laterality: Left;  . SKIN SPLIT GRAFT Left 08/04/2018   Procedure: SKIN GRAFT SPLIT THICKNESS LEFT FOOT;  Surgeon: Nadara Mustarduda, Marcus V, MD;  [facility];  Service: Orthopedics;  Laterality: Left;  . SKIN SPLIT GRAFT Left 09/08/2018   Procedure: SPLIT THICKNESS SKIN GRAFT LEFT FOOT;  Surgeon: Nadara Mustarduda, Marcus V, MD;  [facility];  Service: Orthopedics;  Laterality: Left;   Social History   Occupational History  . Not on file  Tobacco Use  . Smoking status: Never Smoker  . Smokeless tobacco: Never Used  Substance and Sexual Activity  . Alcohol use: Never    Frequency: Never  . Drug use: Never  . Sexual activity: Not on file

## 2018-09-28 ENCOUNTER — Encounter: Payer: Self-pay | Admitting: Orthopedic Surgery

## 2018-09-28 ENCOUNTER — Other Ambulatory Visit: Payer: Self-pay

## 2018-09-28 ENCOUNTER — Ambulatory Visit (INDEPENDENT_AMBULATORY_CARE_PROVIDER_SITE_OTHER): Payer: Worker's Compensation | Admitting: Orthopedic Surgery

## 2018-09-28 VITALS — Ht 60.0 in | Wt 149.0 lb

## 2018-09-28 DIAGNOSIS — S93325S Dislocation of tarsometatarsal joint of left foot, sequela: Secondary | ICD-10-CM

## 2018-09-28 DIAGNOSIS — S9782XS Crushing injury of left foot, sequela: Secondary | ICD-10-CM

## 2018-09-28 NOTE — Progress Notes (Signed)
Office Visit Note   Patient: Katelyn Chapman           Date of Birth: 03-Jun-1965           MRN: [id number] Visit Date: 09/28/2018              Requested by: Oswaldo Conroy, MD [address] [place], [place] [address] PCP: Oswaldo Conroy, MD  Chief Complaint  Patient presents with  . Left Foot - Routine Post Op    09/08/2018 [facility] left foot work comp       HPI: The patient is a 53 yo woman who is seen for post operative follow up following placement of fetal bovine dermal graft on 09/08/2018. The graft has incorporated well and the overall wound size is much improved. She reports a significant improvement in her pain level following starting the Cymbalta and with further healing of the wound. She does note some occasional electrical type sensation and pain over the plantar foot at times, but overall is much better with regards to pain.  She is seen with her case Production designer, theatre/television/film and a Teacher, English as a foreign language.    Assessment & Plan: Visit Diagnoses:  1. Crushing injury of left foot, sequela   2. Lisfranc dislocation, left, sequela     Plan: She can begin washing the foot daily with soap and water and apply a XL shrinker stocking to the foot and wear this around the clock. She was given a prescription for a Vive shrinker stocking today. Instructed to continue to work on range of motion at the ankle.  She will follow up in 1 week.   Follow-Up Instructions: Return in about 1 week (around 10/05/2018).   Ortho Exam  Patient is alert, oriented, no adenopathy, well-dressed, normal affect, normal respiratory effort. The graft is well incorporated and the overall wound size has decreased significantly. There is no sign of infection or cellulitis. There is moderate edema. She is non tender with dressing and applying a shrinker here in the office today. She has good palpable pedal pulses.   Imaging: No results found.   Labs: No results found for: HGBA1C, ESRSEDRATE, CRP,  LABURIC, REPTSTATUS, GRAMSTAIN, CULT, LABORGA   Lab Results  Component Value Date   ALBUMIN 4.1 07/24/2018    Body mass index is 29.1 kg/m.  Orders:  No orders of the defined types were placed in this encounter.  No orders of the defined types were placed in this encounter.    Procedures: No procedures performed  Clinical Data: No additional findings.  ROS:  All other systems negative, except as noted in the HPI. Review of Systems  Objective: Vital Signs: Ht 5' (1.524 m)   Wt 149 lb (67.6 kg)   BMI 29.10 kg/m   Specialty Comments:  No specialty comments available.  PMFS History: Patient Active Problem List   Diagnosis Date Noted  . Open wound of foot except toes with complication, left, sequela   . Lisfranc dislocation, left, sequela   . Gangrene of left foot (HCC)   . Crush injury of left foot 07/24/2018  . 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 Displaced fracture of first metatarsal bone, left foot, initial encounter for open fracture 07/24/2018  . Displaced fracture of second metatarsal bone, left foot, initial encounter for open fracture 07/24/2018  . Displaced fracture of third metatarsal bone, left foot, initial encounter for open fracture 07/24/2018   Past Medical History:  Diagnosis Date  . Medical history non-contributory     History reviewed. No pertinent family history.  Past Surgical History:  Procedure Laterality Date  . AMPUTATION TOE Left 07/24/2018   Procedure: AMPUTATION TOE;  Surgeon: Roby LoftsHaddix, Kevin P, MD;  [facility];  Service: Orthopedics;  Laterality: Left;  . CHOLECYSTECTOMY    . I&D EXTREMITY Left 07/24/2018   Procedure: IRRIGATION AND DEBRIDEMENT EXTREMITY;  Surgeon: Roby LoftsHaddix, Kevin P, MD;  [facility]: [facility];  Service: Orthopedics;  Laterality: Left;  . I&D EXTREMITY Left 08/02/2018   Procedure: DEBRIDEMENT LEFT FOOT, PLACE VAC;  Surgeon: Nadara Mustarduda, Marcus V, MD;  [facility]: [facility];   Service: Orthopedics;  Laterality: Left;  . ORIF ANKLE FRACTURE Left 08/04/2018   Procedure: OPEN REDUCTION INTERNAL FIXATION (ORIF) LISFRANC FRACTURE DISLOCATION LEFT FOOT;  Surgeon: Nadara Mustarduda, Marcus V, MD;  [facility];  Service: Orthopedics;  Laterality: Left;  . ORIF TOE FRACTURE Left 07/24/2018   Procedure: OPEN REDUCTION INTERNAL FIXATION (ORIF) METATARSAL (TOE) FRACTURE;  Surgeon: Roby LoftsHaddix, Kevin P, MD;  [facility];  Service: Orthopedics;  Laterality: Left;  . SKIN SPLIT GRAFT Left 08/04/2018   Procedure: SKIN GRAFT SPLIT THICKNESS LEFT FOOT;  Surgeon: Nadara Mustarduda, Marcus V, MD;  [facility];  Service: Orthopedics;  Laterality: Left;  . SKIN SPLIT GRAFT Left 09/08/2018   Procedure: SPLIT THICKNESS SKIN GRAFT LEFT FOOT;  Surgeon: Nadara Mustarduda, Marcus V, MD;  [facility];  Service: Orthopedics;  Laterality: Left;   Social History   Occupational History  . Not on file  Tobacco Use  . Smoking status: Never Smoker  . Smokeless tobacco: Never Used  Substance and Sexual Activity  . Alcohol use: Never    Frequency: Never  . Drug use: Never  . Sexual activity: Not on file

## 2018-10-05 ENCOUNTER — Other Ambulatory Visit: Payer: Self-pay

## 2018-10-05 ENCOUNTER — Ambulatory Visit (INDEPENDENT_AMBULATORY_CARE_PROVIDER_SITE_OTHER): Payer: Worker's Compensation | Admitting: Orthopedic Surgery

## 2018-10-05 ENCOUNTER — Encounter: Payer: Self-pay | Admitting: Orthopedic Surgery

## 2018-10-05 VITALS — Ht 60.0 in | Wt 149.0 lb

## 2018-10-05 DIAGNOSIS — S9782XS Crushing injury of left foot, sequela: Secondary | ICD-10-CM

## 2018-10-05 DIAGNOSIS — Z9889 Other specified postprocedural states: Secondary | ICD-10-CM

## 2018-10-05 MED ORDER — OXYCODONE-ACETAMINOPHEN 5-325 MG PO TABS: 1.0000 | ORAL_TABLET | Freq: Four times a day (QID) | ORAL | 0 refills | Status: DC | PRN

## 2018-10-05 NOTE — Progress Notes (Signed)
Office Visit Note   Patient: Katelyn Chapman           Date of Birth: 06/27/1965           MRN: [id number] Visit Date: 10/05/2018              Requested by: Oswaldo Conroy, MD [address] [place], [place] [address] PCP: Oswaldo Conroy, MD  Chief Complaint  Patient presents with  . Left Foot - Routine Post Op    09/08/18 [facility] left foot       HPI: The patient is a 53 yo woman who is seen for post operative follow up following a placement of fetal bovine dermal graft on 09/08/2018. The graft continues to incorporate well. She is continuing to have some pain over the area and periodically takes some hydrocodone for this. She is also taking gabapentin 300 mg TID and Cymbalta 30 mg daily.  She is seen with a spanish interpretor and her case manager following the initial assessment.   Assessment & Plan: Visit Diagnoses:  1. History of artificial skin graft   2. Crushing injury of left foot, sequela     Plan: The hypergranulation over the medial foot was treated with silver nitrate and a nylon suture was removed over the lateral foot.  Continue to wash the foot daily with soap and water and apply XL shrinker stocking to the foot and continue to wear around the clock.  Continue to work on range of motion at the ankle.  She should remain out of work x 4 more weeks. Hydrocodone was refilled today.   Follow-Up Instructions: Return in about 1 week (around 10/12/2018).   Ortho Exam  Patient is alert, oriented, no adenopathy, well-dressed, normal affect, normal respiratory effort. The graft has incorporated well and she is continuing to epithelialize about the borders of the wound. There is some hypergranulation medially and this was treated with silver nitrate. Moderate edema over the area. Good palpable pedal pulses. Lateral suture removed.    Imaging: No results found.   Labs: No results found for: HGBA1C, ESRSEDRATE, CRP, LABURIC, REPTSTATUS,  GRAMSTAIN, CULT, LABORGA   Lab Results  Component Value Date   ALBUMIN 4.1 07/24/2018    Body mass index is 29.1 kg/m.  Orders:  No orders of the defined types were placed in this encounter.  Meds ordered this encounter  Medications  . oxyCODONE-acetaminophen (PERCOCET/ROXICET) 5-325 MG tablet    Sig: Take 1 tablet by mouth every 6 (six) hours as needed for moderate pain or severe pain.    Dispense:  30 tablet    Refill:  0    Please Add directions in Spanish     Procedures: No procedures performed  Clinical Data: No additional findings.  ROS:  All other systems negative, except as noted in the HPI. Review of Systems  Objective: Vital Signs: Ht 5' (1.524 m)   Wt 149 lb (67.6 kg)   BMI 29.10 kg/m   Specialty Comments:  No specialty comments available.  PMFS History: Patient Active Problem List   Diagnosis Date Noted  . Open wound of foot except toes with complication, left, sequela   . Lisfranc dislocation, left, sequela   . Gangrene of left foot (HCC)   . Crush injury of left foot 07/24/2018  . 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 Displaced fracture of first metatarsal bone, left foot, initial encounter for open fracture  07/24/2018  . Displaced fracture of second metatarsal bone, left foot, initial encounter for open fracture 07/24/2018  . Displaced fracture of third metatarsal bone, left foot, initial encounter for open fracture 07/24/2018   Past Medical History:  Diagnosis Date  . Medical history non-contributory     History reviewed. No pertinent family history.  Past Surgical History:  Procedure Laterality Date  . AMPUTATION TOE Left 07/24/2018   Procedure: AMPUTATION TOE;  Surgeon: Roby LoftsHaddix, Kevin P, MD;  [facility];  Service: Orthopedics;  Laterality: Left;  . CHOLECYSTECTOMY    . I&D EXTREMITY Left 07/24/2018   Procedure: IRRIGATION AND DEBRIDEMENT EXTREMITY;  Surgeon: Roby LoftsHaddix, Kevin  P, MD;  [facility]: [facility];  Service: Orthopedics;  Laterality: Left;  . I&D EXTREMITY Left 08/02/2018   Procedure: DEBRIDEMENT LEFT FOOT, PLACE VAC;  Surgeon: Nadara Mustarduda, Marcus V, MD;  [facility];  Service: Orthopedics;  Laterality: Left;  . ORIF ANKLE FRACTURE Left 08/04/2018   Procedure: OPEN REDUCTION INTERNAL FIXATION (ORIF) LISFRANC FRACTURE DISLOCATION LEFT FOOT;  Surgeon: Nadara Mustarduda, Marcus V, MD;  [facility];  Service: Orthopedics;  Laterality: Left;  . ORIF TOE FRACTURE Left 07/24/2018   Procedure: OPEN REDUCTION INTERNAL FIXATION (ORIF) METATARSAL (TOE) FRACTURE;  Surgeon: Roby LoftsHaddix, Kevin P, MD;  [facility];  Service: Orthopedics;  Laterality: Left;  . SKIN SPLIT GRAFT Left 08/04/2018   Procedure: SKIN GRAFT SPLIT THICKNESS LEFT FOOT;  Surgeon: Nadara Mustarduda, Marcus V, MD;  [facility];  Service: Orthopedics;  Laterality: Left;  . SKIN SPLIT GRAFT Left 09/08/2018   Procedure: SPLIT THICKNESS SKIN GRAFT LEFT FOOT;  Surgeon: Nadara Mustarduda, Marcus V, MD;  [facility];  Service: Orthopedics;  Laterality: Left;   Social History   Occupational History  . Not on file  Tobacco Use  . Smoking status: Never Smoker  . Smokeless tobacco: Never Used  Substance and Sexual Activity  . Alcohol use: Never    Frequency: Never  . Drug use: Never  . Sexual activity: Not on file

## 2018-10-12 ENCOUNTER — Encounter: Payer: Self-pay | Admitting: Orthopedic Surgery

## 2018-10-12 ENCOUNTER — Ambulatory Visit (INDEPENDENT_AMBULATORY_CARE_PROVIDER_SITE_OTHER): Payer: Worker's Compensation | Admitting: Orthopedic Surgery

## 2018-10-12 ENCOUNTER — Other Ambulatory Visit: Payer: Self-pay

## 2018-10-12 VITALS — Ht 60.0 in | Wt 149.0 lb

## 2018-10-12 DIAGNOSIS — S93325S Dislocation of tarsometatarsal joint of left foot, sequela: Secondary | ICD-10-CM

## 2018-10-12 DIAGNOSIS — Z9889 Other specified postprocedural states: Secondary | ICD-10-CM

## 2018-10-12 DIAGNOSIS — S9782XS Crushing injury of left foot, sequela: Secondary | ICD-10-CM

## 2018-10-12 NOTE — Progress Notes (Signed)
Office Visit Note   Patient: Katelyn Chapman           Date of Birth: Sep 04, 1965           MRN: [id number] Visit Date: 10/12/2018              Requested by: Oswaldo Conroy, MD [address] [place], [place] [address] PCP: Oswaldo Conroy, MD  Chief Complaint  Patient presents with  . Left Foot - Routine Post Op    Work comp 09/08/18 [facility]SG left foot      HPI: The patient was a 53 yo woman who is seen for post operative follow up following placement of fetal bovine dermal graft on 09/08/2018. The graft is well incorporated. A retained staple was removed. She reports her pain is much better and she is using taking Cymbalta for pain and this is working well.   Assessment & Plan: Visit Diagnoses:  1. Crushing injury of left foot, sequela   2. History of artificial skin graft   3. Lisfranc dislocation, left, sequela     Plan: Continue shrinker stocking around the clock to the left foot after washing daily with soap and water. Residual staple removed today.  Continue to work on ankle range of motion exercises.  She remains out of work.   Follow-Up Instructions: Return in about 1 week (around 10/19/2018).   Ortho Exam  Patient is alert, oriented, no adenopathy, well-dressed, normal affect, normal respiratory effort. The graft is well incorporated and the residual wound is continuing to improve and is much smaller. There is excellent beefy red granulation and no signs of infection or cellulitis. Ankle range of motion improving.   Imaging: No results found.   Labs: No results found for: HGBA1C, ESRSEDRATE, CRP, LABURIC, REPTSTATUS, GRAMSTAIN, CULT, LABORGA   Lab Results  Component Value Date   ALBUMIN 4.1 07/24/2018    Body mass index is 29.1 kg/m.  Orders:  No orders of the defined types were placed in this encounter.  No orders of the defined types were placed in this encounter.    Procedures: No procedures performed  Clinical Data:  No additional findings.  ROS:  All other systems negative, except as noted in the HPI. Review of Systems  Objective: Vital Signs: Ht 5' (1.524 m)   Wt 149 lb (67.6 kg)   BMI 29.10 kg/m   Specialty Comments:  No specialty comments available.  PMFS History: Patient Active Problem List   Diagnosis Date Noted  . Open wound of foot except toes with complication, left, sequela   . Lisfranc dislocation, left, sequela   . Gangrene of left foot (HCC)   . Crush injury of left foot 07/24/2018  . 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 Displaced fracture of first metatarsal bone, left foot, initial encounter for open fracture 07/24/2018  . Displaced fracture of second metatarsal bone, left foot, initial encounter for open fracture 07/24/2018  . Displaced fracture of third metatarsal bone, left foot, initial encounter for open fracture 07/24/2018   Past Medical History:  Diagnosis Date  . Medical history non-contributory     History reviewed. No pertinent family history.  Past Surgical History:  Procedure Laterality Date  . AMPUTATION TOE Left 07/24/2018   Procedure: AMPUTATION TOE;  Surgeon: Roby Lofts, MD;  [facility];  Service: Orthopedics;  Laterality: Left;  . CHOLECYSTECTOMY    . I&D EXTREMITY Left 07/24/2018  Procedure: IRRIGATION AND DEBRIDEMENT EXTREMITY;  Surgeon: Roby LoftsHaddix, Kevin P, MD;  [facility];  Service: Orthopedics;  Laterality: Left;  . I&D EXTREMITY Left 08/02/2018   Procedure: DEBRIDEMENT LEFT FOOT, PLACE VAC;  Surgeon: Nadara Mustarduda, Marcus V, MD;  [facility];  Service: Orthopedics;  Laterality: Left;  . ORIF ANKLE FRACTURE Left 08/04/2018   Procedure: OPEN REDUCTION INTERNAL FIXATION (ORIF) LISFRANC FRACTURE DISLOCATION LEFT FOOT;  Surgeon: Nadara Mustarduda, Marcus V, MD;  [facility];  Service: Orthopedics;  Laterality: Left;  . ORIF TOE FRACTURE Left 07/24/2018   Procedure: OPEN REDUCTION INTERNAL FIXATION  (ORIF) METATARSAL (TOE) FRACTURE;  Surgeon: Roby LoftsHaddix, Kevin P, MD;  [facility];  Service: Orthopedics;  Laterality: Left;  . SKIN SPLIT GRAFT Left 08/04/2018   Procedure: SKIN GRAFT SPLIT THICKNESS LEFT FOOT;  Surgeon: Nadara Mustarduda, Marcus V, MD;  [facility];  Service: Orthopedics;  Laterality: Left;  . SKIN SPLIT GRAFT Left 09/08/2018   Procedure: SPLIT THICKNESS SKIN GRAFT LEFT FOOT;  Surgeon: Nadara Mustarduda, Marcus V, MD;  [facility];  Service: Orthopedics;  Laterality: Left;   Social History   Occupational History  . Not on file  Tobacco Use  . Smoking status: Never Smoker  . Smokeless tobacco: Never Used  Substance and Sexual Activity  . Alcohol use: Never    Frequency: Never  . Drug use: Never  . Sexual activity: Not on file

## 2018-10-19 ENCOUNTER — Other Ambulatory Visit: Payer: Self-pay

## 2018-10-19 ENCOUNTER — Encounter: Payer: Self-pay | Admitting: Orthopedic Surgery

## 2018-10-19 ENCOUNTER — Ambulatory Visit (INDEPENDENT_AMBULATORY_CARE_PROVIDER_SITE_OTHER): Payer: Worker's Compensation | Admitting: Orthopedic Surgery

## 2018-10-19 VITALS — Ht 60.0 in | Wt 149.0 lb

## 2018-10-19 DIAGNOSIS — Z9889 Other specified postprocedural states: Secondary | ICD-10-CM

## 2018-10-19 DIAGNOSIS — S9782XS Crushing injury of left foot, sequela: Secondary | ICD-10-CM

## 2018-10-19 DIAGNOSIS — S93325S Dislocation of tarsometatarsal joint of left foot, sequela: Secondary | ICD-10-CM

## 2018-10-19 NOTE — Progress Notes (Signed)
Office Visit Note   Patient: Katelyn Chapman           Date of Birth: Sep 30, 1965           MRN: [id number] Visit Date: 10/19/2018              Requested by: Oswaldo Conroy, MD [address] [place], [place] [address] PCP: Oswaldo Conroy, MD  Chief Complaint  Patient presents with  . Left Foot - Routine Post Op    09/08/18 STSG left foot.       HPI: The patient is a 53 yo woman who is seen for post operative follow up following placement of fetal bovine dermal graft on 09/08/2018. The graft is well incorporated. Retained suture removed from the lateral foot margin. She continues to report less pain and is taking Cymbalta for pain as well as occasional Percocet when the pain is more severe. She is wearing a silver shrinker stocking daily after washing with soap and water. She is non weight bearing with a walker.   Assessment & Plan: Visit Diagnoses:  1. Crushing injury of left foot, sequela   2. History of artificial skin graft   3. Lisfranc dislocation, left, sequela     Plan: The hypergranulation was treated with silver nitrate. Retained suture removed from lateral foot. Continue to wash the foot daily and apply medical shrinker stocking around the clock. Continue to work on ankle range of motion exercises.   Follow up in 1 week.   Follow-Up Instructions: Return in about 1 week (around 10/26/2018).   Ortho Exam  Patient is alert, oriented, no adenopathy, well-dressed, normal affect, normal respiratory effort. The left foot graft has incorporated well and there is some mild hypergranulation over the wound bed which was treated with silver nitrate. Wound size continues to decrease and there are no signs of infection or cellulitis. Mild residual edema of the residual foot/inferior flap. Good pedal pulses.   Imaging: No results found.   Labs: No results found for: HGBA1C, ESRSEDRATE, CRP, LABURIC, REPTSTATUS, GRAMSTAIN, CULT, LABORGA   Lab Results   Component Value Date   ALBUMIN 4.1 07/24/2018    Body mass index is 29.1 kg/m.  Orders:  No orders of the defined types were placed in this encounter.  No orders of the defined types were placed in this encounter.    Procedures: No procedures performed  Clinical Data: No additional findings.  ROS:  All other systems negative, except as noted in the HPI. Review of Systems  Objective: Vital Signs: Ht 5' (1.524 m)   Wt 149 lb (67.6 kg)   BMI 29.10 kg/m   Specialty Comments:  No specialty comments available.  PMFS History: Patient Active Problem List   Diagnosis Date Noted  . Open wound of foot except toes with complication, left, sequela   . Lisfranc dislocation, left, sequela   . Gangrene of left foot (HCC)   . Crush injury of left foot 07/24/2018  . 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 Displaced fracture of first metatarsal bone, left foot, initial encounter for open fracture 07/24/2018  . Displaced fracture of second metatarsal bone, left foot, initial encounter for open fracture 07/24/2018  . Displaced fracture of third metatarsal bone, left foot, initial encounter for open fracture 07/24/2018   Past Medical History:  Diagnosis Date  . Medical history non-contributory     History reviewed. No pertinent family history.  Past Surgical History:  Procedure Laterality Date  . AMPUTATION TOE Left 07/24/2018   Procedure: AMPUTATION TOE;  Surgeon: Roby LoftsHaddix, Kevin P, MD;  [facility];  Service: Orthopedics;  Laterality: Left;  . CHOLECYSTECTOMY    . I&D EXTREMITY Left 07/24/2018   Procedure: IRRIGATION AND DEBRIDEMENT EXTREMITY;  Surgeon: Roby LoftsHaddix, Kevin P, MD;  [facility];  Service: Orthopedics;  Laterality: Left;  . I&D EXTREMITY Left 08/02/2018   Procedure: DEBRIDEMENT LEFT FOOT, PLACE VAC;  Surgeon: Nadara Mustarduda, Marcus V, MD;  [facility];  Service: Orthopedics;  Laterality: Left;  . ORIF ANKLE  FRACTURE Left 08/04/2018   Procedure: OPEN REDUCTION INTERNAL FIXATION (ORIF) LISFRANC FRACTURE DISLOCATION LEFT FOOT;  Surgeon: Nadara Mustarduda, Marcus V, MD;  [facility];  Service: Orthopedics;  Laterality: Left;  . ORIF TOE FRACTURE Left 07/24/2018   Procedure: OPEN REDUCTION INTERNAL FIXATION (ORIF) METATARSAL (TOE) FRACTURE;  Surgeon: Roby LoftsHaddix, Kevin P, MD;  [facility];  Service: Orthopedics;  Laterality: Left;  . SKIN SPLIT GRAFT Left 08/04/2018   Procedure: SKIN GRAFT SPLIT THICKNESS LEFT FOOT;  Surgeon: Nadara Mustarduda, Marcus V, MD;  [facility];  Service: Orthopedics;  Laterality: Left;  . SKIN SPLIT GRAFT Left 09/08/2018   Procedure: SPLIT THICKNESS SKIN GRAFT LEFT FOOT;  Surgeon: Nadara Mustarduda, Marcus V, MD;  [facility];  Service: Orthopedics;  Laterality: Left;   Social History   Occupational History  . Not on file  Tobacco Use  . Smoking status: Never Smoker  . Smokeless tobacco: Never Used  Substance and Sexual Activity  . Alcohol use: Never    Frequency: Never  . Drug use: Never  . Sexual activity: Not on file

## 2018-10-21 ENCOUNTER — Telehealth: Payer: Self-pay | Admitting: Internal Medicine

## 2018-10-21 DIAGNOSIS — Z20822 Contact with and (suspected) exposure to covid-19: Secondary | ICD-10-CM

## 2018-10-21 NOTE — Telephone Encounter (Signed)
Pt called to notify that she has possibly been exposed to covid-19 and to schedule an appointment, using a spanish interpretor.  Pt agreed to testing, requesting to go to [place] at the [facility] building on Monday around 2 pm Advised to wear a mask, stay in the car with window rolled up until time for testing. Pt voiced understanding.

## 2018-10-23 ENCOUNTER — Other Ambulatory Visit: Payer: Self-pay

## 2018-10-23 ENCOUNTER — Other Ambulatory Visit: Payer: Self-pay | Admitting: Hematology

## 2018-10-23 DIAGNOSIS — Z20822 Contact with and (suspected) exposure to covid-19: Secondary | ICD-10-CM

## 2018-10-23 NOTE — Progress Notes (Signed)
COVID screening released in error.  Order re-entered

## 2018-10-24 LAB — NOVEL CORONAVIRUS, NAA: SARS-CoV-2, NAA: NOT DETECTED

## 2018-10-26 ENCOUNTER — Other Ambulatory Visit: Payer: Self-pay

## 2018-10-26 ENCOUNTER — Encounter: Payer: Self-pay | Admitting: Physician Assistant

## 2018-10-26 ENCOUNTER — Ambulatory Visit (INDEPENDENT_AMBULATORY_CARE_PROVIDER_SITE_OTHER): Payer: Worker's Compensation | Admitting: Orthopedic Surgery

## 2018-10-26 ENCOUNTER — Encounter: Payer: Self-pay | Admitting: Orthopedic Surgery

## 2018-10-26 DIAGNOSIS — S9782XS Crushing injury of left foot, sequela: Secondary | ICD-10-CM

## 2018-10-26 MED ORDER — OXYCODONE-ACETAMINOPHEN 5-325 MG PO TABS: 1.0000 | ORAL_TABLET | Freq: Four times a day (QID) | ORAL | 0 refills | Status: AC | PRN

## 2018-10-26 NOTE — Progress Notes (Signed)
Office Visit Note   Patient: Katelyn Chapman           Date of Birth: 12/13/1965           MRN: [id number] Visit Date: 10/26/2018              Requested by: Oswaldo ConroyBender, Abby Daneele, MD [address] [place], [place] [id number] PCP: Oswaldo ConroyBender, Abby Daneele, MD  Chief Complaint  Patient presents with  . Left Foot - Routine Post Op    09/08/18 STSG left foot      HPI: Patient is a 53 year old woman who presents follow-up status post foot salvage intervention for her left foot.  Patient is taking 1 Percocet at night for pain she is pleased with her progress  Assessment & Plan: Visit Diagnoses:  1. Crushing injury of left foot, sequela     Plan: A refill prescription for Percocet was called and she will continue wearing the medical compression stocking daily change daily.  She is given instructions to work on dorsiflexion exercises of her ankle.  Follow-Up Instructions: Return in about 1 week (around 11/02/2018).   Ortho Exam  Patient is alert, oriented, no adenopathy, well-dressed, normal affect, normal respiratory effort. Examination patient has shown excellent improvement with the epithelization to the left foot.  There is good healthy epithelialization good wound healing good granulation tissue no cellulitis no drainage.  Patient is currently wearing the stump shrinker.  Imaging: No results found.   Labs: No results found for: HGBA1C, ESRSEDRATE, CRP, LABURIC, REPTSTATUS, GRAMSTAIN, CULT, LABORGA   Lab Results  Component Value Date   ALBUMIN 4.1 07/24/2018    Body mass index is 29.1 kg/m.  Orders:  No orders of the defined types were placed in this encounter.  Meds ordered this encounter  Medications  . oxyCODONE-acetaminophen (PERCOCET/ROXICET) 5-325 MG tablet    Sig: Take 1 tablet by mouth every 6 (six) hours as needed for moderate pain or severe pain.    Dispense:  30 tablet    Refill:  0    Please Add directions in Spanish     Procedures:  No procedures performed  Clinical Data: No additional findings.  ROS:  All other systems negative, except as noted in the HPI. Review of Systems  Objective: Vital Signs: Ht 5' (1.524 m)   Wt 149 lb (67.6 kg)   BMI 29.10 kg/m   Specialty Comments:  No specialty comments available.  PMFS History: Patient Active Problem List   Diagnosis Date Noted  . Open wound of foot except toes with complication, left, sequela   . Lisfranc dislocation, left, sequela   . Gangrene of left foot (HCC)   . Crush injury of left foot 07/24/2018  . 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 Displaced fracture of first metatarsal bone, left foot, initial encounter for open fracture 07/24/2018  . Displaced fracture of second metatarsal bone, left foot, initial encounter for open fracture 07/24/2018  . Displaced fracture of third metatarsal bone, left foot, initial encounter for open fracture 07/24/2018   Past Medical History:  Diagnosis Date  . Medical history non-contributory     History reviewed. No pertinent family history.  Past Surgical History:  Procedure Laterality Date  . AMPUTATION TOE Left 07/24/2018   Procedure: AMPUTATION TOE;  Surgeon: Roby LoftsHaddix, Kevin P, MD;  [facility];  Service: Orthopedics;  Laterality: Left;  . CHOLECYSTECTOMY    . I&D EXTREMITY Left 07/24/2018  Procedure: IRRIGATION AND DEBRIDEMENT EXTREMITY;  Surgeon: Roby Lofts, MD;  [facility];  Service: Orthopedics;  Laterality: Left;  . I&D EXTREMITY Left 08/02/2018   Procedure: DEBRIDEMENT LEFT FOOT, PLACE VAC;  Surgeon: Nadara Mustard, MD;  [facility];  Service: Orthopedics;  Laterality: Left;  . ORIF ANKLE FRACTURE Left 08/04/2018   Procedure: OPEN REDUCTION INTERNAL FIXATION (ORIF) LISFRANC FRACTURE DISLOCATION LEFT FOOT;  Surgeon: Nadara Mustard, MD;  [facility];  Service: Orthopedics;  Laterality: Left;  . ORIF TOE FRACTURE Left 07/24/2018    Procedure: OPEN REDUCTION INTERNAL FIXATION (ORIF) METATARSAL (TOE) FRACTURE;  Surgeon: Roby Lofts, MD;  [facility];  Service: Orthopedics;  Laterality: Left;  . SKIN SPLIT GRAFT Left 08/04/2018   Procedure: SKIN GRAFT SPLIT THICKNESS LEFT FOOT;  Surgeon: Nadara Mustard, MD;  [facility];  Service: Orthopedics;  Laterality: Left;  . SKIN SPLIT GRAFT Left 09/08/2018   Procedure: SPLIT THICKNESS SKIN GRAFT LEFT FOOT;  Surgeon: Nadara Mustard, MD;  [facility];  Service: Orthopedics;  Laterality: Left;   Social History   Occupational History  . Not on file  Tobacco Use  . Smoking status: Never Smoker  . Smokeless tobacco: Never Used  Substance and Sexual Activity  . Alcohol use: Never    Frequency: Never  . Drug use: Never  . Sexual activity: Not on file

## 2018-11-06 ENCOUNTER — Other Ambulatory Visit: Payer: Self-pay

## 2018-11-06 ENCOUNTER — Encounter: Payer: Self-pay | Admitting: Orthopedic Surgery

## 2018-11-06 ENCOUNTER — Ambulatory Visit (INDEPENDENT_AMBULATORY_CARE_PROVIDER_SITE_OTHER): Payer: Worker's Compensation | Admitting: Orthopedic Surgery

## 2018-11-06 ENCOUNTER — Telehealth: Payer: Self-pay | Admitting: Orthopedic Surgery

## 2018-11-06 VITALS — Ht 60.0 in | Wt 149.0 lb

## 2018-11-06 DIAGNOSIS — S93325S Dislocation of tarsometatarsal joint of left foot, sequela: Secondary | ICD-10-CM

## 2018-11-06 DIAGNOSIS — S9782XS Crushing injury of left foot, sequela: Secondary | ICD-10-CM

## 2018-11-06 NOTE — Progress Notes (Signed)
Office Visit Note   Patient: Katelyn Chapman           Date of Birth: 09/09/1965           MRN: [id number] Visit Date: 11/06/2018              Requested by: Letta Median, MD [place],  [place] [address] PCP: Letta Median, MD  Chief Complaint  Patient presents with  . Left Foot - Routine Post Op    STSG left foot 09/08/18      HPI: Patient is a 53 year old woman status post crush injury left foot underwent foot salvage intervention as well as open reduction internal fixation.  Assessment & Plan: Visit Diagnoses:  1. Crushing injury of left foot, sequela   2. Lisfranc dislocation, left, sequela     Plan: Patient is showing excellent improvement.  She was given instructions and demonstrated Achilles stretching to do at least 5 times a day.  Patient is to begin weightbearing as tolerated.  Continue with her compression stocking.  She may use lotion on the new healthy skin.  Patient is still out of work.  Follow-Up Instructions: Return in about 2 weeks (around 11/20/2018).   Ortho Exam  Patient is alert, oriented, no adenopathy, well-dressed, normal affect, normal respiratory effort. Examination patient has excellent improvement of the epithelialization around the skin graft.  There is good healthy granulation tissue at the base of the wound.  The new skin has some venous congestion she will continue with her compression stocking.  There is no redness no cellulitis no signs of infection.  Patient has dorsiflexion about 10 degrees short of neutral and she was given instructions and Achilles stretching and this was demonstrated.  Imaging: No results found.    Labs: No results found for: HGBA1C, ESRSEDRATE, CRP, LABURIC, REPTSTATUS, GRAMSTAIN, CULT, LABORGA   Lab Results  Component Value Date   ALBUMIN 4.1 07/24/2018    Body mass index is 29.1 kg/m.  Orders:  No orders of the defined types were placed in this encounter.  No  orders of the defined types were placed in this encounter.    Procedures: No procedures performed  Clinical Data: No additional findings.  ROS:  All other systems negative, except as noted in the HPI. Review of Systems  Objective: Vital Signs: Ht 5' (1.524 m)   Wt 149 lb (67.6 kg)   BMI 29.10 kg/m   Specialty Comments:  No specialty comments available.  PMFS History: Patient Active Problem List   Diagnosis Date Noted  . Open wound of foot except toes with complication, left, sequela   . Lisfranc dislocation, left, sequela   . Gangrene of left foot ([place])   . Crush injury of left foot 07/24/2018  . 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 Displaced fracture of first metatarsal bone, left foot, initial encounter for open fracture 07/24/2018  . Displaced fracture of second metatarsal bone, left foot, initial encounter for open fracture 07/24/2018  . Displaced fracture of third metatarsal bone, left foot, initial encounter for open fracture 07/24/2018   Past Medical History:  Diagnosis Date  . Medical history non-contributory     History reviewed. No pertinent family history.  Past Surgical History:  Procedure Laterality Date  . AMPUTATION TOE Left 07/24/2018   Procedure: AMPUTATION TOE;  Surgeon: Shona Needles, MD;  Location: [place];  Service: Orthopedics;  Laterality: Left;  .  CHOLECYSTECTOMY    . I&D EXTREMITY Left 07/24/2018   Procedure: IRRIGATION AND DEBRIDEMENT EXTREMITY;  Surgeon: Roby LoftsHaddix, Kevin P, MD;  [facility];  Service: Orthopedics;  Laterality: Left;  . I&D EXTREMITY Left 08/02/2018   Procedure: DEBRIDEMENT LEFT FOOT, PLACE VAC;  Surgeon: Nadara Mustarduda, Jiana Lemaire V, MD;  [facility];  Service: Orthopedics;  Laterality: Left;  . ORIF ANKLE FRACTURE Left 08/04/2018   Procedure: OPEN REDUCTION INTERNAL FIXATION (ORIF) LISFRANC FRACTURE DISLOCATION LEFT FOOT;  Surgeon: Nadara Mustarduda, Amisha Pospisil V, MD;  [facility];   Service: Orthopedics;  Laterality: Left;  . ORIF TOE FRACTURE Left 07/24/2018   Procedure: OPEN REDUCTION INTERNAL FIXATION (ORIF) METATARSAL (TOE) FRACTURE;  Surgeon: Roby LoftsHaddix, Kevin P, MD;  [facility];  Service: Orthopedics;  Laterality: Left;  . SKIN SPLIT GRAFT Left 08/04/2018   Procedure: SKIN GRAFT SPLIT THICKNESS LEFT FOOT;  Surgeon: Nadara Mustarduda, Tambria Pfannenstiel V, MD;  [facility];  Service: Orthopedics;  Laterality: Left;  . SKIN SPLIT GRAFT Left 09/08/2018   Procedure: SPLIT THICKNESS SKIN GRAFT LEFT FOOT;  Surgeon: Nadara Mustarduda, Janaisa Birkland V, MD;  [facility];  Service: Orthopedics;  Laterality: Left;   Social History   Occupational History  . Not on file  Tobacco Use  . Smoking status: Never Smoker  . Smokeless tobacco: Never Used  Substance and Sexual Activity  . Alcohol use: Never    Frequency: Never  . Drug use: Never  . Sexual activity: Not on file

## 2018-11-06 NOTE — Telephone Encounter (Signed)
Pt came in today said she was sent to go get tested for covid and was sent a bill for $580 and she should'nt have been charged.  Per cheryl  Okay to forward to annette

## 2018-11-20 ENCOUNTER — Other Ambulatory Visit: Payer: Self-pay

## 2018-11-20 ENCOUNTER — Ambulatory Visit (INDEPENDENT_AMBULATORY_CARE_PROVIDER_SITE_OTHER): Payer: Worker's Compensation | Admitting: Orthopedic Surgery

## 2018-11-20 ENCOUNTER — Encounter: Payer: Self-pay | Admitting: Orthopedic Surgery

## 2018-11-20 VITALS — Ht 60.0 in | Wt 149.0 lb

## 2018-11-20 DIAGNOSIS — S9782XS Crushing injury of left foot, sequela: Secondary | ICD-10-CM

## 2018-11-20 MED ORDER — OXYCODONE-ACETAMINOPHEN 5-325 MG PO TABS: 1.0000 | ORAL_TABLET | ORAL | 0 refills | Status: AC | PRN

## 2018-11-20 NOTE — Progress Notes (Signed)
Office Visit Note   Patient: Katelyn Chapman           Date of Birth: 11/30/1965           MRN: [id number] Visit Date: 11/20/2018              Requested by: Oswaldo ConroyBender, Abby Daneele, MD [address] [place] [id number] PCP: Oswaldo ConroyBender, Abby Daneele, MD  Chief Complaint  Patient presents with  . Left Foot - Routine Post Op    09/08/18 STSG left foot       HPI: Patient is a 53 year old woman who presents status post crush injury left foot skin graft and limb salvage.  She is currently wearing a medical compression stump shrinker  Assessment & Plan: Visit Diagnoses:  1. Crushing injury of left foot, sequela     Plan: She will follow-up with biotech for a extra-depth shoe orthotic spacer carbon plate and double upright brace.  She is sent in a prescription for Percocet.  Follow-Up Instructions: Return in about 3 weeks (around 12/11/2018).   Ortho Exam  Patient is alert, oriented, no adenopathy, well-dressed, normal affect, normal respiratory effort. Examination patient continues to show excellent healing she has dorsiflexion to neutral the importance of working on the dorsiflexion was discussed.  There is a thin scab covering the wound she will continue with the stump shrinker.  There is no redness no cellulitis no signs of infection.  Patient will wean off the walker when she obtains her shoes and bracing.  She is given a note to be out of work for 4 weeks.  Imaging: No results found.   Labs: No results found for: HGBA1C, ESRSEDRATE, CRP, LABURIC, REPTSTATUS, GRAMSTAIN, CULT, LABORGA   Lab Results  Component Value Date   ALBUMIN 4.1 07/24/2018    Body mass index is 29.1 kg/m.  Orders:  No orders of the defined types were placed in this encounter.  No orders of the defined types were placed in this encounter.    Procedures: No procedures performed  Clinical Data: No additional findings.  ROS:  All other systems negative, except as noted  in the HPI. Review of Systems  Objective: Vital Signs: Ht 5' (1.524 m)   Wt 149 lb (67.6 kg)   BMI 29.10 kg/m   Specialty Comments:  No specialty comments available.  PMFS History: Patient Active Problem List   Diagnosis Date Noted  . Open wound of foot except toes with complication, left, sequela   . Lisfranc dislocation, left, sequela   . Gangrene of left foot (HCC)   . Crush injury of left foot 07/24/2018  . 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 Displaced fracture of first metatarsal bone, left foot, initial encounter for open fracture 07/24/2018  . Displaced fracture of second metatarsal bone, left foot, initial encounter for open fracture 07/24/2018  . Displaced fracture of third metatarsal bone, left foot, initial encounter for open fracture 07/24/2018   Past Medical History:  Diagnosis Date  . Medical history non-contributory     History reviewed. No pertinent family history.  Past Surgical History:  Procedure Laterality Date  . AMPUTATION TOE Left 07/24/2018   Procedure: AMPUTATION TOE;  Surgeon: Roby LoftsHaddix, Kevin P, MD;  [facility];  Service: Orthopedics;  Laterality: Left;  . CHOLECYSTECTOMY    . I&D EXTREMITY Left 07/24/2018   Procedure: IRRIGATION AND DEBRIDEMENT EXTREMITY;  Surgeon: Roby LoftsHaddix, Kevin P, MD;  Location:  [facility];  Service: Orthopedics;  Laterality: Left;  . I&D EXTREMITY Left 08/02/2018   Procedure: DEBRIDEMENT LEFT FOOT, PLACE VAC;  Surgeon: Newt Minion, MD;  Location: [facility];  Service: Orthopedics;  Laterality: Left;  . ORIF ANKLE FRACTURE Left 08/04/2018   Procedure: OPEN REDUCTION INTERNAL FIXATION (ORIF) LISFRANC FRACTURE DISLOCATION LEFT FOOT;  Surgeon: Newt Minion, MD;  Location: [facility];  Service: Orthopedics;  Laterality: Left;  . ORIF TOE FRACTURE Left 07/24/2018   Procedure: OPEN REDUCTION INTERNAL FIXATION (ORIF) METATARSAL (TOE) FRACTURE;  Surgeon: Shona Needles, MD;  Location: [facility];  Service: Orthopedics;  Laterality: Left;  . SKIN SPLIT GRAFT Left 08/04/2018   Procedure: SKIN GRAFT SPLIT THICKNESS LEFT FOOT;  Surgeon: Newt Minion, MD;  Location: [facility];  Service: Orthopedics;  Laterality: Left;  . SKIN SPLIT GRAFT Left 09/08/2018   Procedure: SPLIT THICKNESS SKIN GRAFT LEFT FOOT;  Surgeon: Newt Minion, MD;  Location: [facility];  Service: Orthopedics;  Laterality: Left;   Social History   Occupational History  . Not on file  Tobacco Use  . Smoking status: Never Smoker  . Smokeless tobacco: Never Used  Substance and Sexual Activity  . Alcohol use: Never    Frequency: Never  . Drug use: Never  . Sexual activity: Not on file

## 2018-12-11 ENCOUNTER — Other Ambulatory Visit: Payer: Self-pay

## 2018-12-11 ENCOUNTER — Ambulatory Visit (INDEPENDENT_AMBULATORY_CARE_PROVIDER_SITE_OTHER): Payer: Worker's Compensation | Admitting: Orthopedic Surgery

## 2018-12-11 ENCOUNTER — Encounter: Payer: Self-pay | Admitting: Orthopedic Surgery

## 2018-12-11 VITALS — Ht 60.0 in | Wt 149.0 lb

## 2018-12-11 DIAGNOSIS — S93325S Dislocation of tarsometatarsal joint of left foot, sequela: Secondary | ICD-10-CM | POA: Diagnosis not present

## 2018-12-11 DIAGNOSIS — S9782XS Crushing injury of left foot, sequela: Secondary | ICD-10-CM

## 2018-12-17 ENCOUNTER — Encounter: Payer: Self-pay | Admitting: Orthopedic Surgery

## 2018-12-17 NOTE — Progress Notes (Signed)
Office Visit Note   Patient: Katelyn Chapman           Date of Birth: Apr 03, 1966           MRN: [id number] Visit Date: 12/11/2018              Requested by: Letta Median, MD [place],  [place] [id number] PCP: Letta Median, MD  Chief Complaint  Patient presents with  . Left Foot - Routine Post Op    09/08/18 [facility] left foot       HPI: Patient is a 53 year old woman who is status post crush injury to the left foot she has undergone internal fixation as well as soft tissue reconstruction.  Patient does have an anterior AFO brace and orthotic from [employer].  She states the brace feels like it is a little too tall.  Assessment & Plan: Visit Diagnoses:  1. Crushing injury of left foot, sequela   2. Lisfranc dislocation, left, sequela     Plan: We will have her follow-up with biotech to trim down the front of the brace.  She will follow-up in 4 weeks with 2 view radiographs of the left foot to evaluate the internal fixation.  Plan for MMI and release at follow-up.  Follow-Up Instructions: Return in about 4 weeks (around 01/08/2019).   Ortho Exam  Patient is alert, oriented, no adenopathy, well-dressed, normal affect, normal respiratory effort. Examination patient is ambulating independently with her shoe and brace.  She does have some Achilles tightness and was recommended to continue working on Achilles stretching.  There is dry cracked skin on her foot and recommended moisturizing lotion the wound is well-healed there is no redness no cellulitis no signs of infection.  Patient is developing some knee pain and this is most likely due to hyperextending her knee from the Achilles contracture.  Again reinforced the importance of Achilles stretching.  Imaging: No results found.    Labs: No results found for: HGBA1C, ESRSEDRATE, CRP, LABURIC, REPTSTATUS, GRAMSTAIN, CULT, LABORGA   Lab Results  Component Value Date   ALBUMIN 4.1 07/24/2018     No results found for: MG No results found for: VD25OH  No results found for: PREALBUMIN CBC EXTENDED Latest Ref Rng & Units 09/08/2018 07/25/2018 07/24/2018  WBC 4.0 - 10.5 K/uL 6.2 12.2(H) 16.3(H)  RBC 3.87 - 5.11 MIL/uL 4.29 4.11 4.95  HGB 12.0 - 15.0 g/dL 10.9(L) 11.8(L) 13.8  HCT 36.0 - 46.0 % 36.2 35.4(L) 43.5  PLT 150 - 400 K/uL 334 200 211  NEUTROABS 1.7 - 7.7 K/uL - - 12.9(H)  LYMPHSABS 0.7 - 4.0 K/uL - - 2.5     Body mass index is 29.1 kg/m.  Orders:  No orders of the defined types were placed in this encounter.  No orders of the defined types were placed in this encounter.    Procedures: No procedures performed  Clinical Data: No additional findings.  ROS:  All other systems negative, except as noted in the HPI. Review of Systems  Objective: Vital Signs: Ht 5' (1.524 m)   Wt 149 lb (67.6 kg)   BMI 29.10 kg/m   Specialty Comments:  No specialty comments available.  PMFS History: Patient Active Problem List   Diagnosis Date Noted  . Open wound of foot except toes with complication, left, sequela   . Lisfranc dislocation, left, sequela   . Gangrene of left foot ([place])   . Crush injury of left foot 07/24/2018  .  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 Displaced fracture of first metatarsal bone, left foot, initial encounter for open fracture 07/24/2018  . Displaced fracture of second metatarsal bone, left foot, initial encounter for open fracture 07/24/2018  . Displaced fracture of third metatarsal bone, left foot, initial encounter for open fracture 07/24/2018   Past Medical History:  Diagnosis Date  . Medical history non-contributory     History reviewed. No pertinent family history.  Past Surgical History:  Procedure Laterality Date  . AMPUTATION TOE Left 07/24/2018   Procedure: AMPUTATION TOE;  Surgeon: Roby LoftsHaddix, Kevin P, MD;  [facility];  Service: Orthopedics;  Laterality: Left;   . CHOLECYSTECTOMY    . I&D EXTREMITY Left 07/24/2018   Procedure: IRRIGATION AND DEBRIDEMENT EXTREMITY;  Surgeon: Roby LoftsHaddix, Kevin P, MD;  [facility];  Service: Orthopedics;  Laterality: Left;  . I&D EXTREMITY Left 08/02/2018   Procedure: DEBRIDEMENT LEFT FOOT, PLACE VAC;  Surgeon: Nadara Mustarduda, Kimimila Tauzin V, MD;  [facility];  Service: Orthopedics;  Laterality: Left;  . ORIF ANKLE FRACTURE Left 08/04/2018   Procedure: OPEN REDUCTION INTERNAL FIXATION (ORIF) LISFRANC FRACTURE DISLOCATION LEFT FOOT;  Surgeon: Nadara Mustarduda, Christianne Zacher V, MD;  [facility];  Service: Orthopedics;  Laterality: Left;  . ORIF TOE FRACTURE Left 07/24/2018   Procedure: OPEN REDUCTION INTERNAL FIXATION (ORIF) METATARSAL (TOE) FRACTURE;  Surgeon: Roby LoftsHaddix, Kevin P, MD;  [facility];  Service: Orthopedics;  Laterality: Left;  . SKIN SPLIT GRAFT Left 08/04/2018   Procedure: SKIN GRAFT SPLIT THICKNESS LEFT FOOT;  Surgeon: Nadara Mustarduda, Greig Altergott V, MD;  [facility];  Service: Orthopedics;  Laterality: Left;  . SKIN SPLIT GRAFT Left 09/08/2018   Procedure: SPLIT THICKNESS SKIN GRAFT LEFT FOOT;  Surgeon: Nadara Mustarduda, Linetta Regner V, MD;  [facility];  Service: Orthopedics;  Laterality: Left;   Social History   Occupational History  . Not on file  Tobacco Use  . Smoking status: Never Smoker  . Smokeless tobacco: Never Used  Substance and Sexual Activity  . Alcohol use: Never    Frequency: Never  . Drug use: Never  . Sexual activity: Not on file

## 2019-01-11 ENCOUNTER — Encounter: Payer: Self-pay | Admitting: Orthopedic Surgery

## 2019-01-11 ENCOUNTER — Ambulatory Visit (INDEPENDENT_AMBULATORY_CARE_PROVIDER_SITE_OTHER): Payer: Worker's Compensation

## 2019-01-11 ENCOUNTER — Ambulatory Visit (INDEPENDENT_AMBULATORY_CARE_PROVIDER_SITE_OTHER): Payer: Worker's Compensation | Admitting: Orthopedic Surgery

## 2019-01-11 VITALS — Ht 60.0 in | Wt 149.0 lb

## 2019-01-11 DIAGNOSIS — S9782XS Crushing injury of left foot, sequela: Secondary | ICD-10-CM | POA: Diagnosis not present

## 2019-01-14 ENCOUNTER — Encounter: Payer: Self-pay | Admitting: Orthopedic Surgery

## 2019-01-14 NOTE — Progress Notes (Signed)
Office Visit Note   Patient: Katelyn Chapman           Date of Birth: 09/25/1965           MRN: [id number] Visit Date: 01/11/2019              Requested by: Letta Median, MD [place],   [id number] PCP: Letta Median, MD  Chief Complaint  Patient presents with   Left Foot - Follow-up      HPI: Patient is a 53 year old woman who was seen initially by herself and the interpreter and then with her caseworker.  Patient is 4 months status post foot salvage intervention for crush injury to the left foot.  She is currently wearing a brace and regular shoewear.  Patient states she has swelling with walking she is not wearing her compression sock.  Assessment & Plan: Visit Diagnoses:  1. Crushing injury of left foot, sequela     Plan: Recommended that she wear the compression sock to decrease the swelling.  Reviewing her medications she is currently off the Neurontin.  She is still on the Cymbalta and we will continue this as needed.  I have reviewed the patient's work requirements and patient will not be able to do lifting according to her current job description.  Patient is given a note that her maximum lifting is 15 pounds she may stand 3 hours a day and sit as needed no squatting patient currently cannot work with her current job description.  In review of the [place] guidelines for permanent partial impairment patient's permanent partial impairment of the left foot would be 90%.  This is for amputation of half the foot with ankylosis of the subtalar joint is less than an ideal position that requires her to use a brace for ambulation.  Follow-Up Instructions: Return if symptoms worsen or fail to improve.   Ortho Exam  Patient is alert, oriented, no adenopathy, well-dressed, normal affect, normal respiratory effort. Examination patient's skin graft has completely healed she does have some swelling she has contractures  of the subtalar joint that requires her use of brace for ambulation.  She still has venous stasis swelling and recommended that she wear the compression stocking for at least a year and maybe permanently.  Imaging: No results found.   Labs: No results found for: HGBA1C, ESRSEDRATE, CRP, LABURIC, REPTSTATUS, GRAMSTAIN, CULT, LABORGA   Lab Results  Component Value Date   ALBUMIN 4.1 07/24/2018    No results found for: MG No results found for: VD25OH  No results found for: PREALBUMIN CBC EXTENDED Latest Ref Rng & Units 09/08/2018 07/25/2018 07/24/2018  WBC 4.0 - 10.5 K/uL 6.2 12.2(H) 16.3(H)  RBC 3.87 - 5.11 MIL/uL 4.29 4.11 4.95  HGB 12.0 - 15.0 g/dL 10.9(L) 11.8(L) 13.8  HCT 36.0 - 46.0 % 36.2 35.4(L) 43.5  PLT 150 - 400 K/uL 334 200 211  NEUTROABS 1.7 - 7.7 K/uL - - 12.9(H)  LYMPHSABS 0.7 - 4.0 K/uL - - 2.5     Body mass index is 29.1 kg/m.  Orders:  Orders Placed This Encounter  Procedures   XR Foot 2 Views Left   No orders of the defined types were placed in this encounter.    Procedures: No procedures performed  Clinical Data: No additional findings.  ROS:  All other systems negative, except as noted in the HPI. Review of Systems  Objective: Vital Signs: Ht 5' (1.524 m)  Wt 149 lb (67.6 kg)    BMI 29.10 kg/m   Specialty Comments:  No specialty comments available.  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 No pertinent family history.  Past Surgical History:  Procedure Laterality Date   AMPUTATION TOE Left 07/24/2018   Procedure: AMPUTATION TOE;  Surgeon: Roby Chapman, Katelyn P, MD;  [facility];  Service: Orthopedics;  Laterality: Left;   CHOLECYSTECTOMY     I&D EXTREMITY Left 07/24/2018   Procedure: IRRIGATION AND DEBRIDEMENT EXTREMITY;  Surgeon: Roby Chapman, Katelyn P, MD;  [facility];  Service: Orthopedics;  Laterality: Left;   I&D EXTREMITY Left 08/02/2018   Procedure: DEBRIDEMENT LEFT FOOT, PLACE VAC;  Surgeon: Nadara Chapman, Katelyn Tarver V, MD;  [facility];  Service: Orthopedics;  Laterality: Left;   ORIF ANKLE FRACTURE Left 08/04/2018   Procedure: OPEN REDUCTION INTERNAL FIXATION (ORIF) LISFRANC FRACTURE DISLOCATION LEFT FOOT;  Surgeon: Nadara Chapman, Katelyn Beggs V, MD;  [facility];  Service: Orthopedics;  Laterality: Left;   ORIF TOE FRACTURE Left 07/24/2018   Procedure: OPEN REDUCTION INTERNAL FIXATION (ORIF) METATARSAL (TOE) FRACTURE;  Surgeon: Roby Chapman, Katelyn P, MD;  [facility];  Service: Orthopedics;  Laterality: Left;   SKIN SPLIT GRAFT Left 08/04/2018   Procedure: SKIN GRAFT SPLIT THICKNESS LEFT FOOT;  Surgeon: Nadara Chapman, Katelyn Rueth V, MD;  [facility];  Service: Orthopedics;  Laterality: Left;   SKIN SPLIT GRAFT Left 09/08/2018   Procedure: SPLIT THICKNESS SKIN GRAFT LEFT FOOT;  Surgeon: Nadara Chapman, Katelyn Begin V, MD;  [facility];  Service: Orthopedics;  Laterality: Left;   Social History   Occupational History   Not on file  Tobacco Use   Smoking status: Never Smoker   Smokeless tobacco: Never Used  Substance and Sexual Activity   Alcohol use: Never    Frequency: Never   Drug use: Never   Sexual activity: Not on file

## 2019-12-25 IMAGING — DX DG FOOT COMPLETE 3+V*L*
2 series · 3 of 3 positions shown · non-contrast
Comparison: Intraoperative fluoroscopy 07/24/2018

CLINICAL DATA: Left foot injury with multiple amputations.

EXAM:
LEFT FOOT - COMPLETE 3+ VIEW

[Series 1: foot · 0.14mm/px · 2 of 2 slices shown]
[im 1/2]
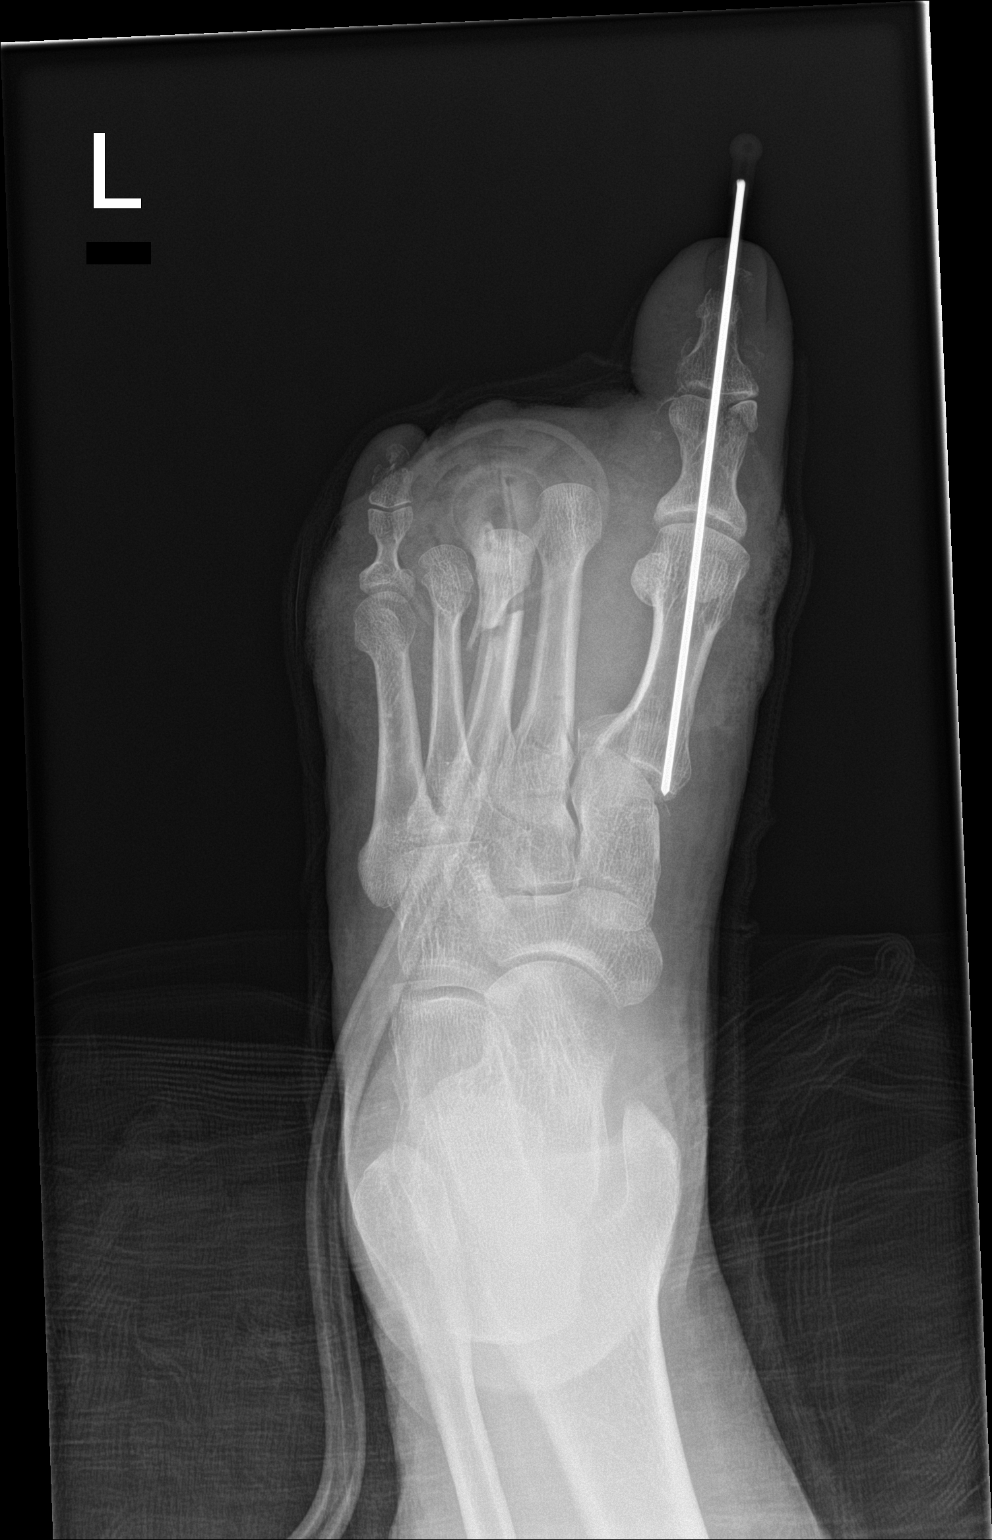
[im 2/2]
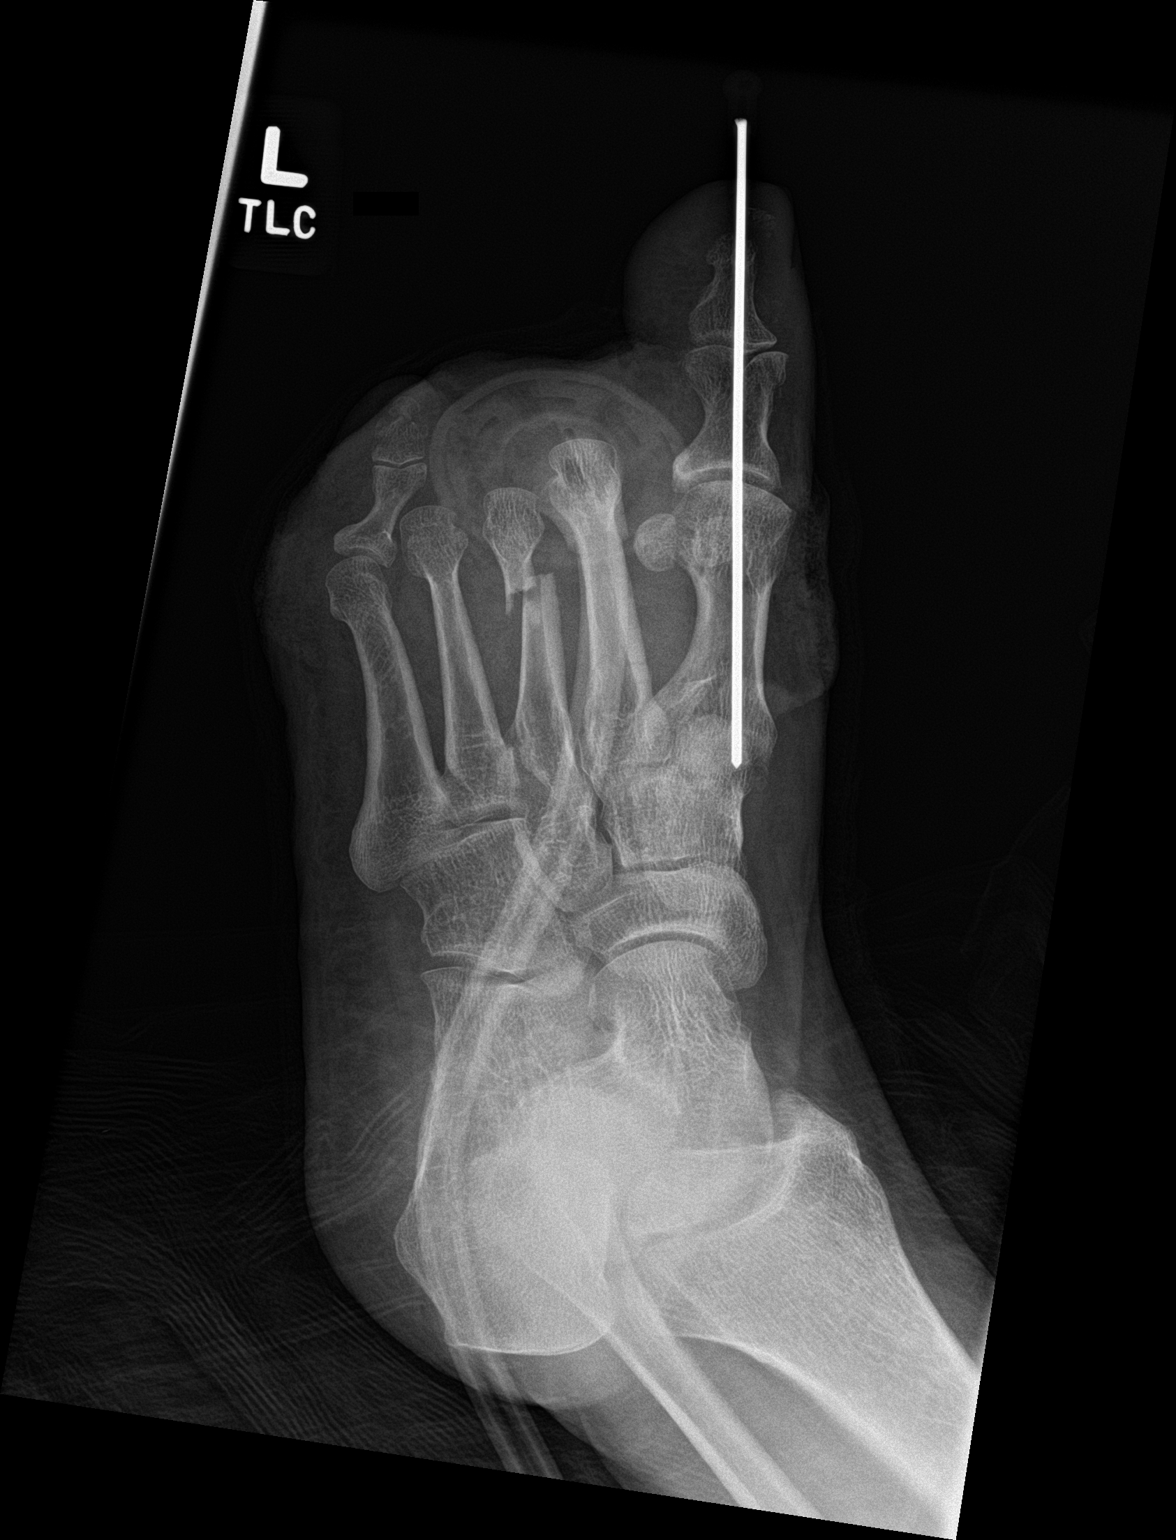

[leg]
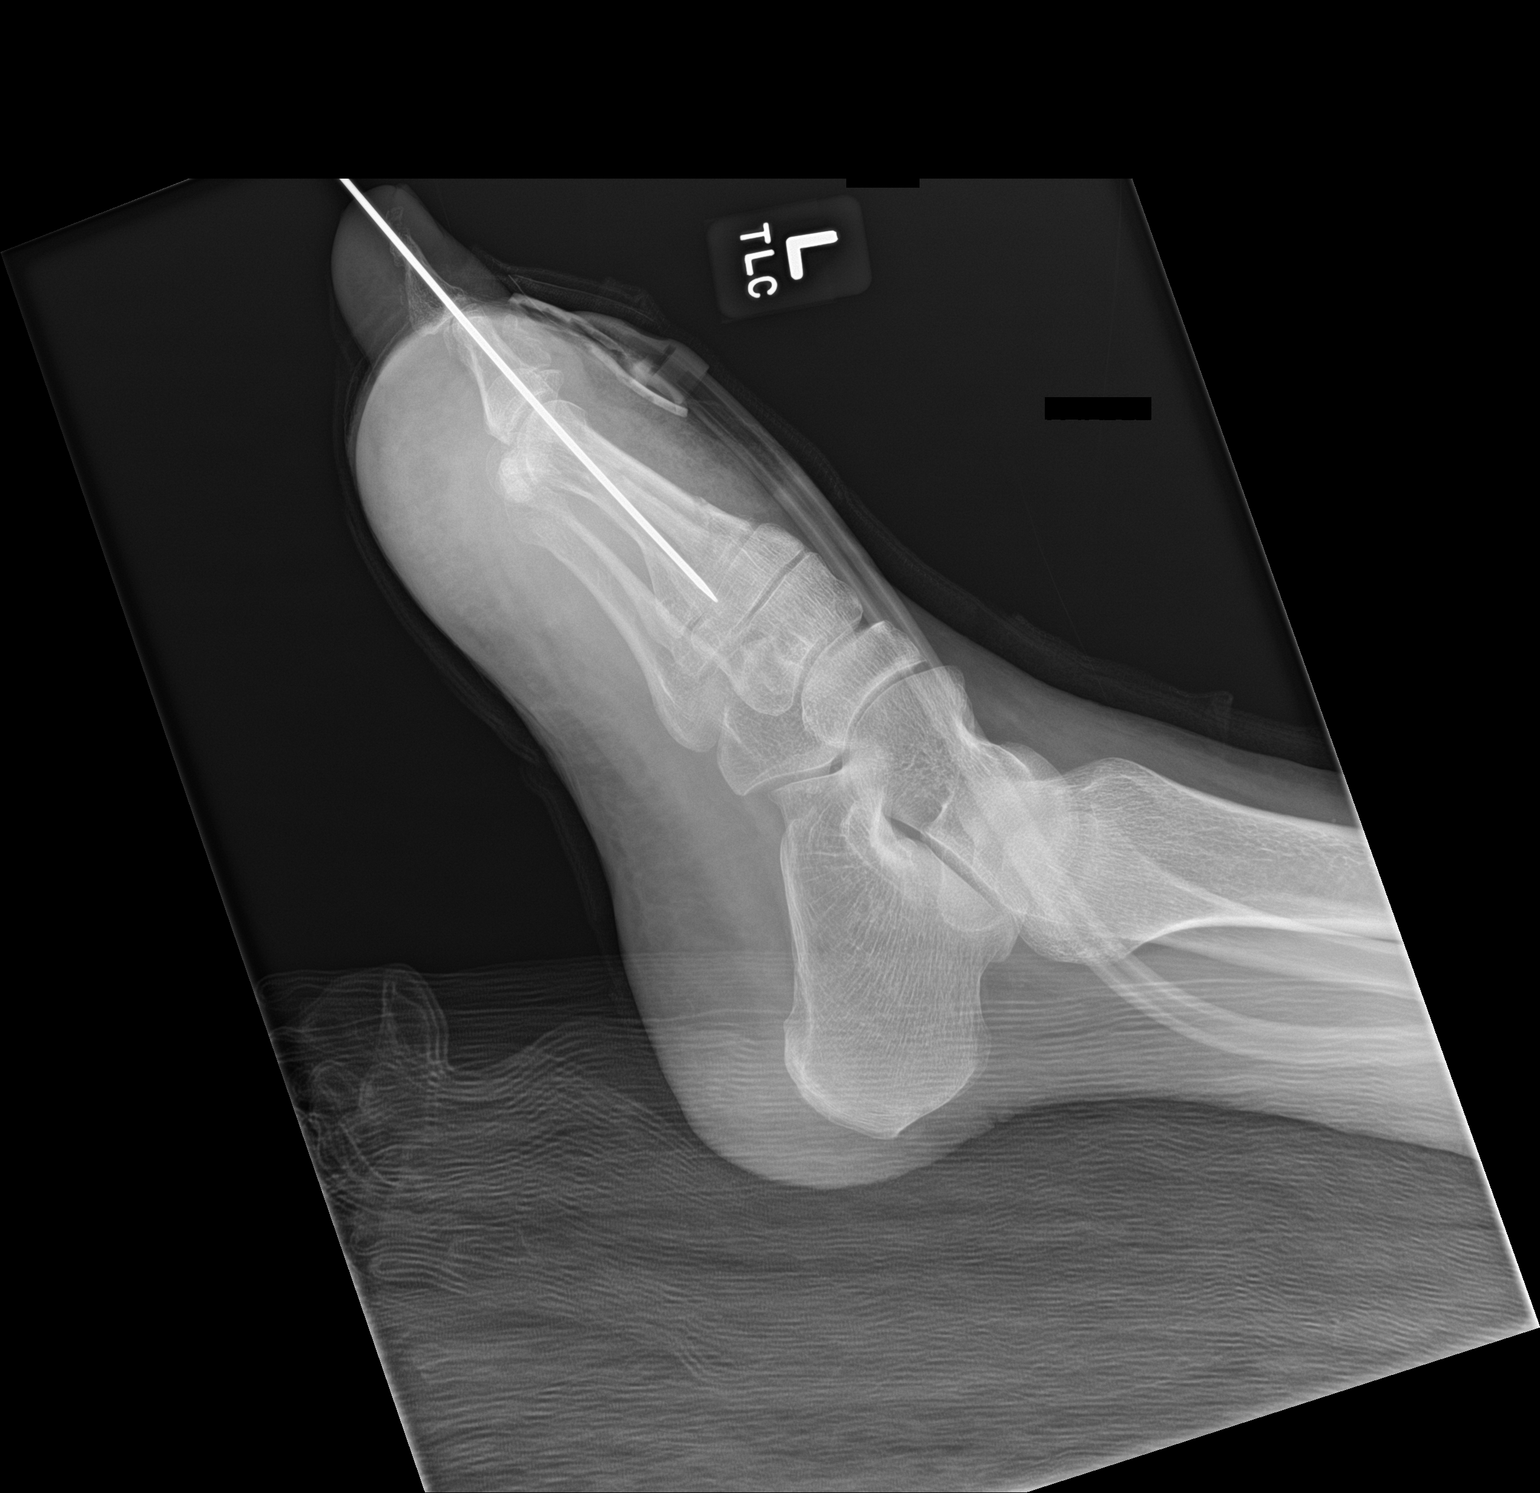

[3 of 3 positions shown; findings below may reference images not displayed]

FINDINGS: Percutaneous 10 fixation of the first ray extending through both
phalanxes and the metatarsal. The second, third and fourth digits
have been amputated at the metatarsophalangeal joint. There are
oblique fractures of the proximal second and distal third
metatarsals. The fifth rays intact. There is generalized soft tissue
swelling. Vacuum assisted wound dressing is present over the
anterior dorsum of the foot.
IMPRESSION: 1. Amputation of the second, third and fourth digits at the
metatarsophalangeal joint, with oblique fractures of the proximal
second and distal third metatarsals.
2. Percutaneous fixation of the first ray with minimally displaced
fractures of the middle and distal phalanges

## 2024-01-27 ENCOUNTER — Inpatient Hospital Stay
Admission: RE | Admit: 2024-01-27 | Discharge: 2024-01-27 | Disposition: A | Discharge status: Home / Self Care | Payer: Self-pay | Source: Ambulatory Visit | Attending: Family Medicine | Admitting: Family Medicine

## 2024-01-27 ENCOUNTER — Other Ambulatory Visit: Payer: Self-pay | Admitting: *Deleted

## 2024-01-27 DIAGNOSIS — Z1231 Encounter for screening mammogram for malignant neoplasm of breast: Secondary | ICD-10-CM

## 2024-04-05 ENCOUNTER — Other Ambulatory Visit: Payer: Self-pay | Admitting: Family Medicine

## 2024-04-05 ENCOUNTER — Other Ambulatory Visit: Payer: Self-pay

## 2024-04-05 ENCOUNTER — Encounter: Payer: Self-pay | Admitting: Family Medicine

## 2024-04-05 DIAGNOSIS — Z1231 Encounter for screening mammogram for malignant neoplasm of breast: Secondary | ICD-10-CM

## 2024-04-24 ENCOUNTER — Ambulatory Visit: Payer: Self-pay

## 2024-04-24 ENCOUNTER — Ambulatory Visit
Admission: RE | Admit: 2024-04-24 | Discharge: 2024-04-24 | Disposition: A | Payer: Self-pay | Source: Ambulatory Visit | Attending: Family Medicine | Admitting: Family Medicine

## 2024-04-24 DIAGNOSIS — Z1231 Encounter for screening mammogram for malignant neoplasm of breast: Secondary | ICD-10-CM | POA: Insufficient documentation

## 2024-04-30 ENCOUNTER — Other Ambulatory Visit: Payer: Self-pay | Admitting: Obstetrics and Gynecology

## 2024-04-30 DIAGNOSIS — R928 Other abnormal and inconclusive findings on diagnostic imaging of breast: Secondary | ICD-10-CM

## 2024-05-04 ENCOUNTER — Inpatient Hospital Stay: Admission: RE | Admit: 2024-05-04 | Payer: Self-pay | Source: Ambulatory Visit

## 2024-05-04 ENCOUNTER — Ambulatory Visit: Payer: Self-pay

## 2024-05-29 ENCOUNTER — Ambulatory Visit: Payer: Self-pay | Admitting: Nurse Practitioner

## 2024-05-29 ENCOUNTER — Other Ambulatory Visit: Payer: Self-pay

## 2024-05-29 ENCOUNTER — Ambulatory Visit
Admission: RE | Admit: 2024-05-29 | Discharge: 2024-05-29 | Disposition: A | Discharge status: Home / Self Care | Payer: Self-pay | Source: Ambulatory Visit | Attending: Obstetrics and Gynecology | Admitting: Obstetrics and Gynecology

## 2024-05-29 VITALS — BP 133/69 | Ht 60.0 in | Wt 163.9 lb

## 2024-05-29 DIAGNOSIS — R928 Other abnormal and inconclusive findings on diagnostic imaging of breast: Secondary | ICD-10-CM | POA: Insufficient documentation

## 2024-05-29 DIAGNOSIS — Z1211 Encounter for screening for malignant neoplasm of colon: Secondary | ICD-10-CM

## 2024-05-29 DIAGNOSIS — Z1239 Encounter for other screening for malignant neoplasm of breast: Secondary | ICD-10-CM

## 2024-05-29 NOTE — Progress Notes (Unsigned)
 [name] is a 59 y.o. female who presents to [facility] clinic today with no complaints. Patient referred to B[facility]CP due to having a screening mammogram 04/30/2024, BI-RADS 0: that additional imaging of the right breast is recommended for follow up.   Pap Smear: Pap smear not completed today. Last Pap smear was 01/19/2024 at [facility] clinic and was normal with negative HPV. Per patient has no history of an abnormal Pap smear. Last Pap smear result is available in Epic.   Physical exam: Breasts Breasts symmetrical. Flattened nipples. No skin abnormalities bilateral breasts. No nipple retraction bilateral breasts. No nipple discharge bilateral breasts. No lymphadenopathy. No lumps palpated bilateral breasts.      MS 3D SCR MAMMO BILAT BR (aka MM) Result Date: 04/30/2024 CLINICAL DATA:  Screening. EXAM: DIGITAL SCREENING BILATERAL MAMMOGRAM WITH TOMOSYNTHESIS AND CAD TECHNIQUE: Bilateral screening digital craniocaudal and mediolateral oblique mammograms were obtained. Bilateral screening digital breast tomosynthesis was performed. The images were evaluated with computer-aided detection. COMPARISON:  Previous exam(s). ACR Breast Density Category b: There are scattered areas of fibroglandular density. FINDINGS: In the right breast, calcifications about the slightly upper outer breast, middle third, warrant further evaluation with magnified views. In the left breast, no findings suspicious for malignancy. IMPRESSION: Further evaluation is suggested for calcifications in the right breast. RECOMMENDATION: Diagnostic mammogram of the right breast. (Code:[id number]) The patient will be contacted regarding the findings, and additional imaging will be scheduled. BI-RADS CATEGORY  0: Incomplete: Need additional imaging evaluation. Electronically Signed   By: Katelyn Chapman   On: 04/30/2024 11:19    Pelvic/Bimanual Pap is not indicated today per BCCCP guidelines.   Smoking History: Patient has never  smoked    Patient Navigation: Patient education provided. Access to services provided for patient through COMCAST program. Spanish interpreter Katelyn Chapman from [facility] provided.   Colorectal Cancer Screening: Per patient has never had colonoscopy completed however, did provided FIT testing kit from [facility] in August 2025. She did not complete it. No complaints today.    Breast and Cervical Cancer Risk Assessment: Patient does not have family history of breast cancer, known genetic mutations, or radiation treatment to the chest before age 30. Patient does not have history of cervical dysplasia, immunocompromised, or DES exposure in-utero.  Risk Scores as of Encounter on 05/29/2024     Katelyn Chapman           5-year 1.55%   Lifetime 10.01%   This patient is [place] but has no documented birth country, so the Arab model used data from [place] patients to calculate their risk score. Document a birth country in the Demographics activity for a more accurate score.         Last calculated by Katelyn Tempie SQUIBB, LPN on 12/25/7971 at 12:52 PM        A: BCCCP exam without pap smear. No palpable abnormalities on exam. BI-RADS 0 mammogram. Has not had colon cancer screening.    P: Referred patient to the [facility] for a right breast diagnostic mammogram per recommendation. Appointment scheduled Tuesday, May 29, 2024 at 1340. FIT testing kit provided today.   Katelyn Tinnie MATSU, NP 05/29/2024 1:26 PM
